# Patient Record
Sex: Female | Born: 1955 | Race: Black or African American | Hispanic: No | Marital: Married | Smoking: Never smoker
Health system: Southern US, Community
[De-identification: ages and names within clinical notes are randomized; demographics above are authoritative.]

## PROBLEM LIST (undated history)

## (undated) DIAGNOSIS — I10 Essential (primary) hypertension: Secondary | ICD-10-CM

---

## 2000-03-20 ENCOUNTER — Inpatient Hospital Stay (HOSPITAL_COMMUNITY): Admission: AD | Admit: 2000-03-20 | Discharge: 2000-03-20 | Payer: Self-pay | Admitting: Obstetrics and Gynecology

## 2000-03-27 ENCOUNTER — Other Ambulatory Visit: Admission: RE | Admit: 2000-03-27 | Discharge: 2000-03-27 | Payer: Self-pay | Admitting: Obstetrics and Gynecology

## 2000-05-16 ENCOUNTER — Encounter: Admission: RE | Admit: 2000-05-16 | Discharge: 2000-08-14 | Payer: Self-pay | Admitting: Obstetrics and Gynecology

## 2000-06-09 ENCOUNTER — Observation Stay (HOSPITAL_COMMUNITY): Admission: AD | Admit: 2000-06-09 | Discharge: 2000-06-10 | Payer: Self-pay | Admitting: Obstetrics and Gynecology

## 2000-06-10 ENCOUNTER — Encounter: Payer: Self-pay | Admitting: Family Medicine

## 2000-06-23 ENCOUNTER — Encounter (INDEPENDENT_AMBULATORY_CARE_PROVIDER_SITE_OTHER): Payer: Self-pay | Admitting: Specialist

## 2000-06-23 ENCOUNTER — Inpatient Hospital Stay (HOSPITAL_COMMUNITY): Admission: AD | Admit: 2000-06-23 | Discharge: 2000-06-29 | Payer: Self-pay | Admitting: Obstetrics and Gynecology

## 2000-06-24 ENCOUNTER — Encounter: Payer: Self-pay | Admitting: Obstetrics and Gynecology

## 2000-06-30 ENCOUNTER — Encounter: Admission: RE | Admit: 2000-06-30 | Discharge: 2000-09-28 | Payer: Self-pay | Admitting: Obstetrics and Gynecology

## 2002-08-25 ENCOUNTER — Encounter: Payer: Self-pay | Admitting: Emergency Medicine

## 2002-08-25 ENCOUNTER — Emergency Department (HOSPITAL_COMMUNITY): Admission: EM | Admit: 2002-08-25 | Discharge: 2002-08-25 | Payer: Self-pay | Admitting: Emergency Medicine

## 2007-09-19 ENCOUNTER — Inpatient Hospital Stay (HOSPITAL_COMMUNITY): Admission: EM | Admit: 2007-09-19 | Discharge: 2007-09-30 | Payer: Self-pay | Admitting: Emergency Medicine

## 2007-09-19 ENCOUNTER — Ambulatory Visit: Payer: Self-pay | Admitting: Critical Care Medicine

## 2007-09-20 ENCOUNTER — Encounter (INDEPENDENT_AMBULATORY_CARE_PROVIDER_SITE_OTHER): Payer: Self-pay | Admitting: *Deleted

## 2007-09-24 ENCOUNTER — Ambulatory Visit: Payer: Self-pay | Admitting: Physical Medicine & Rehabilitation

## 2007-09-30 ENCOUNTER — Ambulatory Visit: Payer: Self-pay | Admitting: Physical Medicine & Rehabilitation

## 2007-09-30 ENCOUNTER — Inpatient Hospital Stay (HOSPITAL_COMMUNITY)
Admission: RE | Admit: 2007-09-30 | Discharge: 2007-10-09 | Payer: Self-pay | Admitting: Physical Medicine & Rehabilitation

## 2007-10-15 ENCOUNTER — Encounter
Admission: RE | Admit: 2007-10-15 | Discharge: 2007-11-20 | Payer: Self-pay | Admitting: Physical Medicine & Rehabilitation

## 2007-10-28 ENCOUNTER — Ambulatory Visit: Payer: Self-pay | Admitting: Internal Medicine

## 2007-10-29 ENCOUNTER — Ambulatory Visit: Payer: Self-pay | Admitting: *Deleted

## 2007-11-04 ENCOUNTER — Encounter (INDEPENDENT_AMBULATORY_CARE_PROVIDER_SITE_OTHER): Payer: Self-pay | Admitting: Internal Medicine

## 2007-11-04 ENCOUNTER — Ambulatory Visit: Payer: Self-pay | Admitting: Physical Medicine & Rehabilitation

## 2007-11-04 ENCOUNTER — Encounter
Admission: RE | Admit: 2007-11-04 | Discharge: 2007-11-05 | Payer: Self-pay | Admitting: Physical Medicine & Rehabilitation

## 2007-11-04 ENCOUNTER — Ambulatory Visit: Payer: Self-pay | Admitting: Family Medicine

## 2007-11-04 LAB — CONVERTED CEMR LAB
BUN: 40 mg/dL — ABNORMAL HIGH (ref 6–23)
Basophils Relative: 1 % (ref 0–1)
CO2: 22 meq/L (ref 19–32)
Calcium: 8.8 mg/dL (ref 8.4–10.5)
Chloride: 102 meq/L (ref 96–112)
Cholesterol: 243 mg/dL — ABNORMAL HIGH (ref 0–200)
Creatinine, Ser: 2.61 mg/dL — ABNORMAL HIGH (ref 0.40–1.20)
Eosinophils Relative: 10 % — ABNORMAL HIGH (ref 0–5)
HCT: 31.6 % — ABNORMAL LOW (ref 36.0–46.0)
HDL: 49 mg/dL (ref >39)
Hemoglobin: 9.8 g/dL — ABNORMAL LOW (ref 12.0–15.0)
Lymphocytes Relative: 20 % (ref 12–46)
MCHC: 31 g/dL (ref 30.0–36.0)
Monocytes Absolute: 0.4 10*3/uL (ref 0.1–1.0)
Monocytes Relative: 11 % (ref 3–12)
Neutro Abs: 2.4 10*3/uL (ref 1.7–7.7)
RBC: 4.02 M/uL (ref 3.87–5.11)
Total CHOL/HDL Ratio: 5
Triglycerides: 183 mg/dL — ABNORMAL HIGH (ref <150)

## 2007-11-19 ENCOUNTER — Inpatient Hospital Stay (HOSPITAL_COMMUNITY): Admission: EM | Admit: 2007-11-19 | Discharge: 2007-11-21 | Payer: Self-pay | Admitting: Emergency Medicine

## 2007-11-19 ENCOUNTER — Ambulatory Visit: Payer: Self-pay | Admitting: Cardiology

## 2007-11-20 ENCOUNTER — Encounter (INDEPENDENT_AMBULATORY_CARE_PROVIDER_SITE_OTHER): Payer: Self-pay | Admitting: Neurology

## 2007-11-24 ENCOUNTER — Emergency Department (HOSPITAL_COMMUNITY): Admission: EM | Admit: 2007-11-24 | Discharge: 2007-11-24 | Payer: Self-pay | Admitting: Emergency Medicine

## 2007-12-09 ENCOUNTER — Encounter: Admission: RE | Admit: 2007-12-09 | Discharge: 2008-03-08 | Payer: Self-pay | Admitting: Neurology

## 2007-12-14 ENCOUNTER — Ambulatory Visit: Payer: Self-pay | Admitting: Family Medicine

## 2007-12-14 ENCOUNTER — Ambulatory Visit: Payer: Self-pay | Admitting: Internal Medicine

## 2007-12-14 LAB — CONVERTED CEMR LAB
Ferritin: 21 ng/mL (ref 10–291)
Hgb A: 60.9 % — ABNORMAL LOW (ref 96.8–97.8)
Saturation Ratios: 21 % (ref 20–55)
TIBC: 325 ug/dL (ref 250–470)
UIBC: 258 ug/dL

## 2007-12-28 ENCOUNTER — Ambulatory Visit: Payer: Self-pay | Admitting: Internal Medicine

## 2008-01-11 ENCOUNTER — Encounter
Admission: RE | Admit: 2008-01-11 | Discharge: 2008-01-13 | Payer: Self-pay | Admitting: Physical Medicine & Rehabilitation

## 2008-01-13 ENCOUNTER — Ambulatory Visit: Payer: Self-pay | Admitting: Physical Medicine & Rehabilitation

## 2008-01-18 ENCOUNTER — Ambulatory Visit: Payer: Self-pay | Admitting: Internal Medicine

## 2008-01-18 ENCOUNTER — Encounter: Payer: Self-pay | Admitting: Family Medicine

## 2008-01-18 LAB — CONVERTED CEMR LAB
Albumin: 4.1 g/dL (ref 3.5–5.2)
CO2: 22 meq/L (ref 19–32)
Creatinine, Ser: 2.79 mg/dL — ABNORMAL HIGH (ref 0.40–1.20)
Glucose, Bld: 116 mg/dL — ABNORMAL HIGH (ref 70–99)
Sodium: 141 meq/L (ref 135–145)

## 2008-01-25 ENCOUNTER — Ambulatory Visit: Payer: Self-pay | Admitting: Internal Medicine

## 2008-02-18 ENCOUNTER — Encounter: Payer: Self-pay | Admitting: Family Medicine

## 2008-02-18 ENCOUNTER — Ambulatory Visit: Payer: Self-pay | Admitting: Internal Medicine

## 2008-02-18 LAB — CONVERTED CEMR LAB
BUN: 31 mg/dL — ABNORMAL HIGH (ref 6–23)
Calcium: 8.8 mg/dL (ref 8.4–10.5)
Glucose, Bld: 116 mg/dL — ABNORMAL HIGH (ref 70–99)
Potassium: 4.1 meq/L (ref 3.5–5.3)

## 2008-03-24 ENCOUNTER — Ambulatory Visit: Payer: Self-pay | Admitting: Internal Medicine

## 2008-03-24 ENCOUNTER — Encounter: Payer: Self-pay | Admitting: Family Medicine

## 2008-03-24 LAB — CONVERTED CEMR LAB
BUN: 32 mg/dL — ABNORMAL HIGH (ref 6–23)
Glucose, Bld: 92 mg/dL (ref 70–99)
Potassium: 4.6 meq/L (ref 3.5–5.3)
Pro B Natriuretic peptide (BNP): 64 pg/mL (ref 0.0–100.0)

## 2008-03-25 ENCOUNTER — Encounter: Payer: Self-pay | Admitting: Family Medicine

## 2008-04-12 ENCOUNTER — Encounter
Admission: RE | Admit: 2008-04-12 | Discharge: 2008-04-12 | Payer: Self-pay | Admitting: Physical Medicine & Rehabilitation

## 2008-05-02 ENCOUNTER — Encounter: Payer: Self-pay | Admitting: Family Medicine

## 2008-05-02 ENCOUNTER — Ambulatory Visit: Payer: Self-pay | Admitting: Internal Medicine

## 2008-05-02 LAB — CONVERTED CEMR LAB
BUN: 41 mg/dL — ABNORMAL HIGH (ref 6–23)
Basophils Relative: 1 % (ref 0–1)
Cholesterol: 177 mg/dL (ref 0–200)
Creatinine, Ser: 2.41 mg/dL — ABNORMAL HIGH (ref 0.40–1.20)
Eosinophils Absolute: 0.1 10*3/uL (ref 0.0–0.7)
Eosinophils Relative: 3 % (ref 0–5)
Hemoglobin: 8.9 g/dL — ABNORMAL LOW (ref 12.0–15.0)
MCHC: 31.4 g/dL (ref 30.0–36.0)
MCV: 79.7 fL (ref 78.0–100.0)
Monocytes Absolute: 0.4 10*3/uL (ref 0.1–1.0)
Monocytes Relative: 9 % (ref 3–12)
Neutrophils Relative %: 69 % (ref 43–77)
RBC: 3.55 M/uL — ABNORMAL LOW (ref 3.87–5.11)
Triglycerides: 47 mg/dL (ref <150)

## 2008-05-13 ENCOUNTER — Ambulatory Visit: Payer: Self-pay | Admitting: Family Medicine

## 2008-05-23 ENCOUNTER — Ambulatory Visit (HOSPITAL_COMMUNITY): Admission: RE | Admit: 2008-05-23 | Discharge: 2008-05-23 | Payer: Self-pay | Admitting: Family Medicine

## 2008-05-27 ENCOUNTER — Encounter: Admission: RE | Admit: 2008-05-27 | Discharge: 2008-05-27 | Payer: Self-pay | Admitting: Family Medicine

## 2008-06-01 ENCOUNTER — Ambulatory Visit: Payer: Self-pay | Admitting: Family Medicine

## 2008-06-01 LAB — CONVERTED CEMR LAB
AST: 18 units/L (ref 0–37)
Albumin: 4.1 g/dL (ref 3.5–5.2)
BUN: 38 mg/dL — ABNORMAL HIGH (ref 6–23)
Calcium: 9 mg/dL (ref 8.4–10.5)
Chloride: 104 meq/L (ref 96–112)
Glucose, Bld: 90 mg/dL (ref 70–99)
Potassium: 4.6 meq/L (ref 3.5–5.3)
Total Protein: 7.4 g/dL (ref 6.0–8.3)

## 2008-06-23 ENCOUNTER — Ambulatory Visit: Payer: Self-pay | Admitting: Family Medicine

## 2008-06-23 LAB — CONVERTED CEMR LAB
BUN: 45 mg/dL — ABNORMAL HIGH (ref 6–23)
Calcium: 8.7 mg/dL (ref 8.4–10.5)
Phenytoin Lvl: 14.5 ug/mL (ref 10.0–20.0)
Potassium: 4.6 meq/L (ref 3.5–5.3)
Sodium: 138 meq/L (ref 135–145)

## 2008-07-27 ENCOUNTER — Ambulatory Visit: Payer: Self-pay | Admitting: Internal Medicine

## 2008-08-11 ENCOUNTER — Ambulatory Visit: Payer: Self-pay | Admitting: *Deleted

## 2008-08-22 ENCOUNTER — Ambulatory Visit: Payer: Self-pay | Admitting: Internal Medicine

## 2008-08-22 ENCOUNTER — Encounter: Payer: Self-pay | Admitting: Family Medicine

## 2008-08-22 LAB — CONVERTED CEMR LAB
Albumin: 4.7 g/dL (ref 3.5–5.2)
Basophils Absolute: 0 10*3/uL (ref 0.0–0.1)
CO2: 22 meq/L (ref 19–32)
Calcium, Total (PTH): 9.8 mg/dL (ref 8.4–10.5)
Chloride: 100 meq/L (ref 96–112)
Eosinophils Relative: 3 % (ref 0–5)
Glucose, Bld: 115 mg/dL — ABNORMAL HIGH (ref 70–99)
HCT: 34.6 % — ABNORMAL LOW (ref 36.0–46.0)
Lymphocytes Relative: 24 % (ref 12–46)
Lymphs Abs: 0.9 10*3/uL (ref 0.7–4.0)
Neutro Abs: 2.5 10*3/uL (ref 1.7–7.7)
PTH: 96.4 pg/mL — ABNORMAL HIGH (ref 14.0–72.0)
Platelets: 226 10*3/uL (ref 150–400)
Potassium: 3.8 meq/L (ref 3.5–5.3)
Sodium: 141 meq/L (ref 135–145)
WBC: 3.7 10*3/uL — ABNORMAL LOW (ref 4.0–10.5)

## 2008-08-26 ENCOUNTER — Emergency Department (HOSPITAL_COMMUNITY): Admission: EM | Admit: 2008-08-26 | Discharge: 2008-08-26 | Payer: Self-pay | Admitting: Emergency Medicine

## 2008-10-19 ENCOUNTER — Encounter: Payer: Self-pay | Admitting: Family Medicine

## 2008-10-19 ENCOUNTER — Ambulatory Visit: Payer: Self-pay | Admitting: Internal Medicine

## 2009-06-06 ENCOUNTER — Ambulatory Visit: Payer: Self-pay | Admitting: Internal Medicine

## 2009-07-20 ENCOUNTER — Emergency Department (HOSPITAL_COMMUNITY): Admission: EM | Admit: 2009-07-20 | Discharge: 2009-07-21 | Payer: Self-pay | Admitting: Emergency Medicine

## 2010-05-07 ENCOUNTER — Ambulatory Visit: Payer: Self-pay | Admitting: Internal Medicine

## 2010-05-07 LAB — CONVERTED CEMR LAB
ALT: 25 units/L (ref 0–35)
Basophils Absolute: 0 10*3/uL (ref 0.0–0.1)
CO2: 21 meq/L (ref 19–32)
CRP: 0.2 mg/dL (ref <0.6)
Calcium: 9.6 mg/dL (ref 8.4–10.5)
Chloride: 106 meq/L (ref 96–112)
Creatinine, Ser: 2.8 mg/dL — ABNORMAL HIGH (ref 0.40–1.20)
Eosinophils Relative: 4 % (ref 0–5)
Glucose, Bld: 103 mg/dL — ABNORMAL HIGH (ref 70–99)
HCT: 30.7 % — ABNORMAL LOW (ref 36.0–46.0)
Hemoglobin: 9.9 g/dL — ABNORMAL LOW (ref 12.0–15.0)
Hgb A1c MFr Bld: 5.9 % — ABNORMAL HIGH (ref <5.7)
Lymphocytes Relative: 31 % (ref 12–46)
Lymphs Abs: 1.3 10*3/uL (ref 0.7–4.0)
Monocytes Absolute: 0.3 10*3/uL (ref 0.1–1.0)
Monocytes Relative: 7 % (ref 3–12)
Neutro Abs: 2.5 10*3/uL (ref 1.7–7.7)
RBC: 3.54 M/uL — ABNORMAL LOW (ref 3.87–5.11)
TSH: 1.593 microintl units/mL (ref 0.350–4.500)
Total Bilirubin: 0.3 mg/dL (ref 0.3–1.2)
Total Protein: 7.4 g/dL (ref 6.0–8.3)
Uric Acid, Serum: 8.7 mg/dL — ABNORMAL HIGH (ref 2.4–7.0)
WBC: 4.2 10*3/uL (ref 4.0–10.5)

## 2010-05-08 ENCOUNTER — Encounter (INDEPENDENT_AMBULATORY_CARE_PROVIDER_SITE_OTHER): Payer: Self-pay | Admitting: *Deleted

## 2010-06-05 ENCOUNTER — Ambulatory Visit: Payer: Self-pay | Admitting: Internal Medicine

## 2010-06-05 LAB — CONVERTED CEMR LAB
BUN: 48 mg/dL — ABNORMAL HIGH (ref 6–23)
CO2: 25 meq/L (ref 19–32)
Glucose, Bld: 91 mg/dL (ref 70–99)
Potassium: 4.4 meq/L (ref 3.5–5.3)
Sodium: 140 meq/L (ref 135–145)

## 2010-11-08 NOTE — Letter (Signed)
Summary: Results Follow-up Letter  [facility]  [address]   [place], [place] [address]   Phone: [phone]  Fax: [phone]    05/08/2010  [address] [place],   [address]  Dear [name],   The following are the results of your recent test(s):  Test     Result     Pap Smear    Normal_______  Not Normal_____       Comments: _________________________________________________________ Cholesterol LDL(Bad cholesterol):    108      Your goal is less than: 100        HDL (Good cholesterol):        Your goal is more than: _________________________________________________________ Other Tests: Your main problem is with your kidney function, your kidneys are "dried out". You SHOULD CONTINUE to take FUROSEMIDE daily and you SHOULD NOT CONTINUE your morning dose of LISINOPRIL/HCTZ.  _________________________________________________________  Please call for an appointment Or DR.Nedra Hai would like for you to return to the office on Friday 05-18-2010@ 11:30am for labs and a blood pressure check. Be sure to take your medications._________________________________________________________ _________________________________________________________ _________________________________________________________  Sincerely,  Willodean Rosenthal

## 2010-11-29 ENCOUNTER — Encounter (INDEPENDENT_AMBULATORY_CARE_PROVIDER_SITE_OTHER): Payer: Self-pay | Admitting: *Deleted

## 2010-11-29 LAB — CONVERTED CEMR LAB
Alkaline Phosphatase: 69 units/L (ref 39–117)
CO2: 26 meq/L (ref 19–32)
Creatinine, Ser: 1.92 mg/dL — ABNORMAL HIGH (ref 0.40–1.20)
Eosinophils Absolute: 0.2 10*3/uL (ref 0.0–0.7)
Eosinophils Relative: 4 % (ref 0–5)
Glucose, Bld: 90 mg/dL (ref 70–99)
HCT: 36 % (ref 36.0–46.0)
Hemoglobin: 11.5 g/dL — ABNORMAL LOW (ref 12.0–15.0)
LDL Cholesterol: 100 mg/dL — ABNORMAL HIGH (ref 0–99)
Lymphs Abs: 1.5 10*3/uL (ref 0.7–4.0)
MCHC: 31.9 g/dL (ref 30.0–36.0)
MCV: 87 fL (ref 78.0–100.0)
Monocytes Absolute: 0.3 10*3/uL (ref 0.1–1.0)
Monocytes Relative: 8 % (ref 3–12)
RBC: 4.14 M/uL (ref 3.87–5.11)
TSH: 2.101 microintl units/mL (ref 0.350–4.500)
Total Bilirubin: 0.3 mg/dL (ref 0.3–1.2)
Triglycerides: 66 mg/dL (ref <150)
VLDL: 13 mg/dL (ref 0–40)
WBC: 4 10*3/uL (ref 4.0–10.5)

## 2011-01-10 LAB — POCT I-STAT, CHEM 8
Glucose, Bld: 101 mg/dL — ABNORMAL HIGH (ref 70–99)
HCT: 34 % — ABNORMAL LOW (ref 36.0–46.0)
Hemoglobin: 11.6 g/dL — ABNORMAL LOW (ref 12.0–15.0)
Potassium: 3.7 mEq/L (ref 3.5–5.1)
Sodium: 140 mEq/L (ref 135–145)
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
## 2011-02-19 NOTE — Discharge Summary (Signed)
NAMEHEYLI, [name] NO.:  [id number]   MEDICAL RECORD NO.:  [id number]          PATIENT TYPE:  IPS   LOCATION:  4028                         FACILITY:  [facility]   PHYSICIAN:  Jarvis Morgan, M.D.   DATE OF BIRTH:  03/13/1956   DATE OF ADMISSION:  09/30/2007  DATE OF DISCHARGE:  10/09/2007                               DISCHARGE SUMMARY   DISCHARGE DIAGNOSES:  1. Left parietal intracerebral hemorrhage.  2. Dysphagia.  3. Seizure disorder.  4. Hypertension.  5. Right lung pneumonia, resolved.  6. Chronic renal insufficiency.  7. Subcutaneous Lovenox for deep vein thrombosis prophylaxis.   A 55 year old [place] American female, admitted September 19, 2007 with  altered mental status and right gaze.  Cranial CT scan showed left  parietal intracerebral hemorrhage.  MRI showed 3.1 cm hematoma left  posterior temporal region.  Carotid Dopplers negative for internal  carotid artery stenosis.  Echocardiogram with ejection fraction 60%,  without thrombi.  Noted seizure.  Loaded with Dilantin.  EEG showed  abnormal study, with predominately bifrontal dealt activity consistent  with FIRDA.  Chronic renal insufficiency, creatinine increased at 2.73.  Renal services followup.  Renal ultrasound negative.  Blood pressure  monitored, with multiple antihypertensive medications.  Swallow study.  Diet advanced to a dysphagia II thin liquid diet.  She was completing a  course of Zosyn for aspiration pneumonia.  She had been placed on  subcutaneous Lovenox for deep vein thrombosis prophylaxis.   PAST MEDICAL HISTORY:  See discharge diagnoses.  No alcohol or tobacco.   SOCIAL HISTORY:  Lives with husband.  She works as a Equities trader.  Husband can assist as needed.  They have three children, ages 29, 11, and  52.   MEDICATIONS PRIOR TO ADMISSION:  Clonidine, tramadol, Lasix, potassium  chloride, and Toprol-XL.   REHABILITATION HOSPITAL COURSE:  The patient was admitted to  inpatient  rehab services, with therapies initiated on a 3-hour daily basis,  consisting of physical therapy, occupational therapy, speech therapy,  and rehabilitation nursing.  The following issues were addressed during  the patient's rehabilitation stay.  Pertaining to Mrs. Johnsie Cancel left  parietal intracerebral hemorrhage conservative care provided.  She was  making slow progressive gains.  Full family teaching was completed.  She  was min to mod assist for her overall mobility.  Diet had been advanced  to a regular-consistency diet.  She remained on Dilantin 300 mg twice  daily for seizure disorder, with no seizure activity detected during her  rehab service stay.  Blood pressures monitored with Norvasc, Catapres,  Lasix, BiDil, Prinivil, and Normodyne.  It was stressed to family the  need to follow up with medical care, which she would see both  [facility] as well as renal services for chronic renal insufficiency,  with baseline creatinine of 1.8-2.2.  She remained on subcutaneous  Lovenox for deep vein thrombosis prophylaxis up until her day of  discharge.   Latest labs showed a sodium of 138, potassium 3.5, BUN 16, creatinine  2.28, with baseline 1.8-2.2.  She was discharged to home.   DISCHARGE MEDICATIONS:  1.  Norvasc 10 mg daily.  2. Clonidine 0.3 mg 1 pill q.8 h.  3. Lasix 80 mg 1 pill twice daily  4. BiDil 1 pill twice daily.  5. Prinivil 20 mg 1 pill twice daily.  6. Dilantin 100 mg 2-1/2 pills at bedtime.  7. Normodyne 300 mg 1 pill twice daily.  8. Prilosec over the counter.  9. Potassium chloride 20 mEq daily.   DIET:  Lowfat, low cholesterol, low carbohydrates.   She would follow up with [facility] on October 22, 2007, as  well as Dr. Florene Glen of renal services on October 27, 2007,  Dr. Jarvis Morgan on November 05, 2007.      Lauraine Rinne, P.A.    ______________________________  Jarvis Morgan, M.D.    DA/MEDQ  D:  10/12/2007  T:  10/12/2007   Job:  [id number]   cc:   Darrold Span. Florene Glen, M.D.  [facility]

## 2011-02-19 NOTE — Discharge Summary (Signed)
NAMEMALAYJA, NICOSIA NO.:  [id number]   MEDICAL RECORD NO.:  [id number]           PATIENT TYPE:   LOCATION:                                 FACILITY:   PHYSICIAN:  Jarvis Morgan, M.D.   DATE OF BIRTH:  07/12/1956   DATE OF ADMISSION:  DATE OF DISCHARGE:                               DISCHARGE SUMMARY   PRIMARY CARE PHYSICIAN:  Unknown.   NEUROLOGIST:  Shaune Pascal. Estella Husk, M.D.   NEPHROLOGIST:  Sherril Croon, M.D.   HISTORY OF PRESENT ILLNESS:  Ms. Bencomo is a 55 year old African American  female with history of hypertension and chronic renal insufficiency with  baseline creatinine of 1.8-2.2.   The patient was admitted September 19, 2007, with altered mental status  along with right gaze preference.  Cranial CT was positive for left  parietal intracranial hemorrhage.  MRI study showed a 3.1 cm hematoma in  the left posterior temporal lobe.  Carotid Dopplers were negative for  internal carotid artery stenosis.  Echocardiogram showed ejection  fraction of 60% without thrombi.  She was noted to have seizures and was  loaded with Dilantin September 26, 2007.  EEG was abnormal with  predominantly bifrontal delta activity consistent with FIRDA.   The patient was noted to have an elevated creatinine to 2.73 and  [facility] was asked to evaluate the patient with Dr. Justin Mend seeing  the patient.  A renal ultrasound was negative.  The patient was  monitored on intake and output, but it was felt that the creatinine was  elevated secondary to baseline creatinine elevation with the addition of  probable contrast dye.   Blood pressure has been monitored.  Metoprolol with clonidine recently  discontinued September 29, 2007, with the addition of Coreg up to 37.5 mg  b.i.d. as of October 05, 2007.   FEES study was done September 24, 2007, and she was made n.p.o. with her  diet recently advanced to D-2 thin liquids.  There was possible  aspiration pneumonia on chest  x-ray September 24, 2007, and she was  treated with IV Zosyn September 21, 2007, with plan for follow-up chest x-  ray.  Subcu Lovenox was added for DVT prophylaxis.  Follow-up cranial CT  September 23, 2007, showed stable unchanged study.  Random blood sugars  have been up and down with plan for check of hemoglobin A1c.   The patient was evaluated by the rehabilitation physicians and felt to  be an appropriate candidate for a trial of inpatient rehabilitation.   REVIEW OF SYSTEMS:  Noncontributory.   PAST MEDICAL HISTORY:  1. Chronic anemia.  2. Hypertension.   FAMILY HISTORY:  Positive for coronary artery disease.   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  Her can  reportedly assist as needed.  They have three children ages 75, 78 and  6.  The patient does not use alcohol or tobacco.   FUNCTIONAL HISTORY PRIOR ADMISSION:  Independent and reportedly working.   ALLERGIES:  NO KNOWN  DRUG ALLERGIES.   MEDICATIONS PRIOR TO ADMISSION:  1. Clonidine 0.1 mg b.i.d.  2. Tramadol one tablet t.i.d. p.r.n.  3. Lasix 20 mg b.i.d.  4. Potassium chloride daily.  5. Toprol XL 50 mg daily.   LABORATORY:  Recent hemoglobin was 8.5, hematocrit of 26, platelet count  of 399,000, white count of 8.5.  Recent Dilantin level was 14.3 on  September 23, 2007.  Recent sodium was 136, potassium 3.7, chloride 108,  bicarbonate 20, BUN 8 and creatinine 2.68 measured September 30, 2007.   PHYSICAL EXAMINATION:  GENERAL APPEARANCE:  A reasonably well-appearing  middle-aged adult female seated up in a chair.  VITAL SIGNS:  Blood pressure was 185/81 with pulse of 86, respiratory  rate 20 and temperature 98.5.  HEENT:  Normocephalic, nontraumatic.  CARDIOVASCULAR:  Regular rate and rhythm, S1-S2 without murmurs.  ABDOMEN:  Soft, slightly obese, nontender with positive bowel sounds.  LUNGS:  Clear to auscultation  bilaterally.  NEUROLOGICAL:  Moderately alert and oriented x1-2 with cues.  She has a  very soft voice was only able to tell me her first name.  She was not  sure where she worked as a Marine scientist but she could tell me that she did work  as a Marine scientist.  She had significant difficulty following even one-step  commands during the manual muscle testing.  Bilateral upper extremity  exam appears to have 4+/5 strength throughout.  Bulk and tone were  normal.  Reflexes were 2+ and symmetrical.  EXTREMITIES:  Lower extremity exam revealed hip flexion, knee extension,  ankle dorsiflexion and 4-/5 bilaterally.   IMPRESSION:  1. Status post left parietal intracranial hemorrhage, likely related      to hypertension.  2. Dysphagia with speech therapy following.  3. History of renal insufficiency with elevated creatinine from      baseline of 1-2.2.  4. Seizure disorder presently on Dilantin b.i.d. after above noted      bleed.  5. Hypertension.  6. Right lung pneumonia with follow-up chest x-ray planned.   Presently the patient has deficits in activities of daily living,  transfers, ambulation and cognition related to the above-noted left  parietal intracranial hemorrhage.   PLAN:  1. Admit to the rehabilitation unit for daily therapies to include      a.     Physical therapy for range of motion, strengthening, bed       mobility, transfers, pregait training, gait training and equipment       evaluation.      b.     Occupational therapy for range of motion, strengthening,       ADLs, cognitive/perceptual training, splinting and equipment       evaluation.      c.     Rehab nursing for skin care, wound care, bowel and bladder       training as necessary.      d.     Speech therapy for higher level cognition and evaluation of       swallow and aphagia.      e.     Case management to assess home environment, assist with       discharge planning and arrange for appropriate follow-up care.      f.      Social work to assess family and social support,       consultation regarding disability issues and assist in discharge       planning.  2. Continue D2 thin liquid diet.  3. Check admission labs including hemoglobin A1c, CBC with      differential, and CMET Friday, October 02, 2007,  4. Incentive spirometry q.2h. while awake.  5. Tylenol 325 mg 1-2 tablets p.o. q.4h. p.r.n. for pain.  6. Coreg 37.5 mg p.o. b.i.d.  7. Handheld nebulizer albuterol 2.5 mg q.6h. p.r.n.  8. Subcu Lovenox 40 mg daily for DVT prophylaxis.  9. Norvasc 10 mg p.o. daily.  10.Protonix 40 mg p.o. daily.  11.Dilantin 250 mg p.o. nightly.  12.BiDil 20/37.5 one tablet b.i.d.  13.IV Zosyn 3.375 grams q.6h. x2 days.  14.PA and lateral chest x-ray to follow up right lung pneumonia.  15.Potassium chloride 20 mEq p.o. daily.  16.Old EKG to chart.  17.Routine turning to prevent skin breakdown.  18.Dulcolax suppository one per rectum daily p.r.n.  19.Strict in's and out's.  20.Discontinue IV fluids if not already done.  21.Discontinue Foley tube.   PROGNOSIS:  Fair.   ESTIMATED LENGTH OF STAY:  Fifteen to 30 days.   GOALS:  Modified independent transfers and ambulation and standby assist  for ADLs with min to mod assist for higher level cognition and household  management.           ______________________________  Jarvis Morgan, M.D.     DC/MEDQ  D:  09/30/2007  T:  09/30/2007  Job:  [id number]

## 2011-02-19 NOTE — Discharge Summary (Signed)
NAMEDEZERAY, MARCIEL              ACCOUNT NO.:  [id number]   MEDICAL RECORD NO.:  [id number]          PATIENT TYPE:  INP   LOCATION:  3013                         FACILITY:  [facility]   PHYSICIAN:  Pramod P. Leonie Man, MD    DATE OF BIRTH:  10-11-1955   DATE OF ADMISSION:  11/19/2007  DATE OF DISCHARGE:  11/21/2007                               DISCHARGE SUMMARY   ADMISSION DIAGNOSIS:  Facial droop.   DISCHARGE DIAGNOSES:  1. 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  2. Hypertension.  3. Recent left parietal intracerebral hemorrhage from December 2008.   DISCHARGE DIAGNOSES:  1. 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  2. Multiple punctate micro-hemorrhages from hypertension in the past.   HOSPITAL COURSE:  [name] is a 55 year old African lady who was  admitted to [facility] with sudden onset of facial droop.  This  started a couple of days prior to admission.  She did not have any focal  extremity weakness or numbness.  She had an MRI scan of the brain done  in the emergency room, which showed acute small infarct in the right  basal ganglia and left frontal subcortical white matter.  She had a  previous history of left parietal brain hemorrhage in December 2008 and  had been off antiplatelet therapy for that.  Her past medical history is  significant for uncontrolled hypertension and a recent brain hemorrhage.  The patient was admitted to the stroke service for evaluation and  remained stable during the hospital stay.  Her blood pressure was  tightly controlled.  She underwent a carotid ultrasound, which showed no  significant stenosis.  Her lipid profile showed elevated total  cholesterol of 233 with LDL of 155.  The patient was started on Zocor  for elevated lipids and aspirin for stroke prevention. Her homocystine  level was elevated 17.1.  Dilantin level was therapeutic  at 11.9,  hemoglobin A1c was borderline at 6.7.  Urine drug screen was negative.  Urinalysis was unremarkable.  Admission CBC and metabolic panel were  both unremarkable.  The patient remained stable during hospitalization.  Her facial droop improved. On the day of discharge she had stable vital  signs with blood pressure 139/79, heart rate 80 per minute, respiratory  20 per minute.  Her facial droop had resolved.  She had minimum  diminished fine finger movements in the left hand.   She was advised to followup with her primary physician for blood  pressure control and she had a transesophageal echocardiogram done on  day prior to discharge, which showed a small patent foramen ovale, but  no cardiac source of embolism.  She was advised to followup with Dr.  Leonie Man in his office in 2 weeks for transcranial Doppler emboli  monitoring and bubble study to further categorize the PFO.  At the time  of discharge she was also was advised to followup with outpatient speech  therapy for swallow and facial exercises.   DISCHARGE MEDICATIONS:  At the time of  discharge her medications were:  1. Zocor 20 mg daily.  2. Aspirin 81 mg daily.  3. Potassium 20 mEq daily.  4. Lasix 80 mg daily.  5. Tramadol as needed.  6. Metoprolol, resume home dose.  7. Clonidine 0.3 mg every 8 hours.  8. Norvasc 10 mg daily.  9. Dilantin 200 mg at bedtime,  10.Hydralazine 25 mg daily.  11.Labetalol 300 twice daily.           ______________________________  Kathie Rhodes. Leonie Man, MD     PPS/MEDQ  D:  11/21/2007  T:  11/23/2007  Job:  [id number]

## 2011-02-19 NOTE — Procedures (Signed)
HISTORY:  This is a 55 year old patient who has sustained a left  parietal intracranial hemorrhage.  The patient is being evaluated for  ongoing confusion.  This is a portable EEG recording.  No skull defects  are noted.   MEDICATIONS:  1. Tylenol.  2. Ventolin.  3. Norvasc.  4. Catapres.  5. NovoLog.  6. Protonix.  7. Dilantin.  8. Lopressor.  9. Potassium.   EEG CLASSIFICATION:  Delta grade 2 bifrontal, FIRDA.   DESCRIPTION:  According to the background rhythm, this recording  consists of a fairly well modulated medium amplitude alpha rhythm of 8  Hz that seems to be reactive to eye opening and closure.  As the record  progresses, the most notable feature of the recording consists of  intermittent bifrontal 2 Hz delta activity that is seen off and on  throughout the entirety of the recording.  At times, the background  rhythms are quite well organized and symmetric.  The patient undergoes  photic stimulation with a minimal bilateral photic drive response.  Hyperventilation is never performed.  At no time during the recording  does there appear to be evidence of spike wave discharges or evidence of  focal slowing.  EKG monitor shows no evidence of cardiac rhythm  abnormalities with a heart rate of 90.   IMPRESSION:  This is an abnormal EEG recording due to intermittent  predominantly bifrontal delta activity that is consistent with FIRDA.  Such a recording suggests process is affecting the deep midline nuclei  or maybe associated with hydrocephalus.  No clear epileptiform  discharges were seen however.  Clinical correlation is required.      Jill Alexanders, M.D.  Electronically Signed     [id number]  D:  09/24/2007 17:18:56  T:  09/25/2007 15:08:40  Job #:  [id number]

## 2011-02-19 NOTE — Consult Note (Signed)
[name], [place]              ACCOUNT NO.:  [id number]   MEDICAL RECORD NO.:  [id number]          PATIENT TYPE:  INP   LOCATION:  1825                         FACILITY:  [facility]   PHYSICIAN:  Burnett Harry. Joya Gaskins, MD, FCCPDATE OF BIRTH:  July 17, 1956   DATE OF CONSULTATION:  09/19/2007  DATE OF DISCHARGE:                                 CONSULTATION   REASON FOR CONSULTATION:  Unresponsive patient with hemorrhagic stroke  with acute respiratory failure and ventilator management.   HISTORY OF PRESENT ILLNESS:  This is a 55 year old African-American  female patient that was found unresponsive at home. She was subsequently  transported to the emergency department at [facility].  Initial  blood pressure was 290/150.  Patient had a grand mal seizure while in  the emergency room and required intubation.  CT scan of the head showed  a left parietal bleed.  Critical care team was called in for  consultation to help with ventilator management.  The patient is  currently in ER room 13.  She is currently unresponsive on the  ventilator.  Blood pressure is currently 200/100 and Cardene drip has  been ordered for her blood pressure.  There are no family members or  past medical history available.  I was able to look her up in the E-  chart system with a discharge summary in 2001 status post a cesarean  section.  According to that note the patient does have a history of  hypertension. She did have preeclampsia during her pregnancy.  Nurse is  at bed side and reports that patient had an episode of what she believes  was V-tach for a very short period of time.   PAST MEDICAL HISTORY:  As above, hypertension and preeclampsia during  pregnancy.   CURRENT MEDICATIONS:  Questionable what she was on prior to admission.  The patient will be placed on a Cardene drip and phenytoin.   ALLERGIES:  Unknown.   FAMILY HISTORY:  Unknown.   SOCIAL HISTORY:  Unknown.   REVIEW OF SYSTEMS:  The  patient, as above, did have a seizure while in  the emergency room.   PHYSICAL EXAMINATION:  GENERAL:  The patient is a morbidly obese,  unresponsive female currently on the ventilator with ET tube in place.  HEENT:  Atraumatic, normocephalic.  Patient with ET tube in place.  Patient has normal dentition without any obvious dental caries. Pupils  are sluggish.  NECK:  Supple without cervical adenopathy.  No JVD.  Carotids are equal,  positive upstrokes bilaterally without any bruits.  LUNGS:  Coarse breath sounds are revealed bilaterally without wheezing  or crackles.  CARDIAC:  Sinus tachycardia.  ABDOMEN:  Morbidly obese, soft.  OG tube is in place to drainage.  EXTREMITIES:  Warm without any notable swelling. Pulses are intact  bilaterally.  NEURO:  Patient is unresponsive and pupils are sluggish.   LABORATORY DATA:  White blood cell count 11,100, hemoglobin 11, platelet  count 380,000.  PT 12.5, INR 0.9.  Sodium 137, potassium 3.2, chloride  105, CO2 26, creatinine 1.62, BUN 34 and glucose 164.  CT scan of the  head shows a left parietal bleed.   IMPRESSION AND PLAN:  1. Acute respiratory failure secondary to unresponsiveness with a      hemorrhagic cerebrovascular accident and malignant hypertension      with subsequent seizure.  Patient was intubated in the emergency      room for airway protection and placed on ventilatory support.  Will      place arterial line. Arterial blood gas is pending. Will adjust      ventilator settings accordingly.  Chest x-ray is also pending and      patient has been taken to radiology.  2. Malignant hypertension.  Patient is to begin a Cardene drip as      ordered.  Titrate for systolic blood pressure between 140-160.  3. Possible episode of ventricular tachycardia versus sinus      tachycardia.  Suspect may be secondary to seizure activity and/or      hypertension. Will check an EKG and one set of cardiac enzymes.  4. Hypokalemia.  Will  replace.  5. Acute renal insufficiency, unclear exactly what patient's baseline      creatine is.  Will continue to monitor and provide intravenous      hydration as indicated.  6. Hyperglycemia.  Questionable secondary to stress and/or possible      underlying diabetes mellitus. Patient is morbidly obese.   Hopefully there will be some family members present soon so we can  obtain a better history.      Rexene Edison, NP      Burnett Harry Joya Gaskins, MD, [facility]  Electronically Signed    TP/MEDQ  D:  09/19/2007  T:  09/20/2007  Job:  [id number]

## 2011-02-19 NOTE — H&P (Signed)
[name], [name]              ACCOUNT NO.:  [id number]   MEDICAL RECORD NO.:  [id number]           PATIENT TYPE:   LOCATION:                               FACILITY:  [place]   PHYSICIAN:  Shaune Pascal. Champey, M.D.DATE OF BIRTH:  05-24-1956   DATE OF ADMISSION:  09/19/2007  DATE OF DISCHARGE:                              HISTORY & PHYSICAL   REQUESTING PHYSICIAN:  Dellis Filbert P. Caporossi, M.D.   REASON FOR ADMISSION:  Code stroke.   HISTORY OF PRESENT ILLNESS:  [name] is a 55 year old African American  female with a past medical history of hypertension, the rest of her past  medical history is unknown.  The patient presents after being found down  unresponsive with a right gaze preference.  A code stroke was called en  route.  In the ED, the patient had some facial twitching, was not moving  any extremities, had an episode of incontinence, subsequently had a  grand mal seizure requiring Ativan.  A CT of the head showed a left  parietal intracerebral hemorrhage, small vessel disease, and lacunes.  The patient was intubated and sedated for airway protection.  No further  history is possible.  No family is currently present.   PAST MEDICAL HISTORY:  Positive for hypertension.  The rest of the past  medical history is unknown at this time.   MEDICATIONS:  Unknown.   FAMILY HISTORY:  Unknown.   ALLERGIES:  No known drug allergies (as per Echart).   SOCIAL HISTORY:  Unknown.   REVIEW OF SYSTEMS:  Unable to assess at this time secondary to sedation  and intubation.   PHYSICAL EXAMINATION:  VITAL SIGNS:  Blood pressure is 235/121, pulse is  127, respirations 16, O2 sat is 100%.  HEENT:  Normocephalic atraumatic.  The patient is sedated and paralyzed.  Pupils are mid size and nonreactive.  No Doll's eyes present.  HEART:  Tachycardic.  LUNGS:  Coarse.  ABDOMEN:  Soft.  EXTREMITIES:  Show good pulses.  NEUROLOGIC:  The patient is intubated, sedated, and paralyzed.  Negative  Doll's eyes.  Pupils are nonreactive.  The patient does have a gag  present slightly, sluggishly.  Motor examination, no spontaneous  movements, tone is decreased, no withdrawal to pain bilaterally on  sensory examination.  Reflexes are none throughout.   CT shows left parietal and cerebral hemorrhage with small vessel disease  and lacunes.   LABORATORY:  WBC 11.1, hemoglobin 11, hematocrit is 35.8, platelets 380.  PT is 12.5, INR is 0.9, PTT is 29.   IMPRESSION:  This is a 55 year old African American female with left  parietal and cerebral hemorrhage, likely secondary to hypertension and  subsequent seizures.   The patient is not a candidate for IV t-PA secondary to intracerebral  hemorrhage and seizures.   1. We will admit the patient to the stroke M.D. service, start a      Cardene drip and titrate to keep systolic blood pressure less than      180.  2. We will load the patient with Dilantin and start maintenance at 100  mg q.8 h.  3. We will check an MRI MRA of the brain, a 2D echocardiogram, carotid      Dopplers, check fasting lipids and homocystine level.  We will get      a PT OT consult.  4. We will consult pulmonary critical care for vent and blood pressure      and continuous monitoring.  5. We will repeat her CT scan in the morning.  6. We will follow the patient.      Shaune Pascal. Estella Husk, M.D.  Electronically Signed     DRC/MEDQ  D:  09/19/2007  T:  09/20/2007  Job:  [id number]

## 2011-02-19 NOTE — Assessment & Plan Note (Signed)
HISTORY:  [name] returns to the clinic today for a follow-up  evaluation, accompanied by her husband.  The patient is a 55 year old  adult female who was admitted on September 19, 2007, with altered mental  status.  An MRI study of the brain showed a 3.1 cm hematoma in the left  posterior/temporal region.  She was subsequently stabilized and was  noted to have seizure activity and was loaded with Dilantin.  She was  subsequently moved to the rehabilitation unit on September 30, 2007, and  remained there through discharge on October 09, 2007.   At this point the patient has completed all therapy and actually had  therapy stopped in February, secondary to a poor response.  She has been  walking without any device and is independent with bathing and dressing.  She still has moderately slow thinking process and slow retrieval of  information.  Her husband reports that she is in the process of trying  to study for a nurse practitioner exam.  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  She has a scheduled test at the end of  this month, but she is not sure if she is ready to take that test.  Most  of the information is gained through her husband.  She is very slow in  terms of her thinking processes and slow to answer questions.  He  generally jumps in to answer questions for her.  He does note that she  is improved in terms of helping with the children at home without  instructions.  That was a problem for her when she initially got home,  but she seems to be taking on more parenteral duties at this time.   REVIEW OF SYSTEMS:  Positive for weight gain and coughing.   MEDICAL DECISION MAKING:  1. Labetalol 200 mg, 1-1/2 tab p.o. twice daily.  2. Clonidine 0.3 mg three times daily.  3. Dilantin 250 mg q.h.s.  4. Isosorbide 20 mg twice daily.  5. Furosemide 80 mg twice daily.  6. Hydralazine 1.5 mg twice  daily.  7. Potassium chloride 20 mEq daily.  8. Norvasc 10 mg daily.  9. Crestor daily.  10.Diovan daily.   PHYSICAL EXAMINATION:  GENERAL:  A well-appearing, slightly overweight  adult female, seated in a regular chair.  VITAL SIGNS:  Blood pressure 146/73, pulse 76, respirations 20, O2  saturation 100% on room air.  NEUROLOGIC:  She ambulates without any assistive device.  Strength was 5-  /5 throughout the bilateral upper and lower extremities.  She does have  slightly slow cognitive abilities and slow response times.   IMPRESSION:  1. Status post left parietal intracerebral hemorrhage.  2. Seizure disorder.  3. Hypertension.  4. Chronic renal insufficiency.  5. Slightly slow cognition.   In the office today no adjustment of medications was made.  She is  focusing on passing her nurse examination practitioner test.  She is not  interested in returning to work at the present time.  I feel that she  would probably have some difficulties and I am concerned that she will  have trouble passing the test, given her present response levels.  Fortunately the test is all written without verbal exam.   FOLLOWUP:  We will plan on seeing the patient in followup in  approximately three months' time.  She is making slow but gradual  progress  overall.           ______________________________  Jarvis Morgan, M.D.     DC/MedQ  D:  01/13/2008 10:41:43  T:  01/13/2008 11:13:00  Job #:  [id number]

## 2011-02-19 NOTE — Assessment & Plan Note (Signed)
[name] returns to the clinic today for followup evaluation  accompanied by her husband.  The patient is a 55 year old African-  American female who was admitted September 19, 2007 with altered mental  status and right gaze preference.  Cranial CT showed left parietal  intracerebral hemorrhage.  MRI studies showed 3.1 cm hematoma in the  left posterior/temporal region.  Carotid Dopplers were negative for  internal carotid artery stenosis.  Echocardiogram showed an ejection  fraction of 60% without thrombi.  She was noted to have seizure activity  and was loaded with dilantin.  EEG showed abnormal study with  predominantly bifrontal delta activity consistent with FIRDA.  Chronic  renal insufficiency was noted with a creatinine of 2.73.  Renal services  plans followup.  A renal ultrasound was negative.  Blood pressure has  been monitored on multiple hypertensive medicines.  She was advanced to  a D2 thin liquid diet.  She was completing a course of Zosyn when she  was moved to the rehabilitation unit September 30, 2007.  She remains  there through discharge October 09, 2007.   Since discharge, the patient has been back to [facility], and they have  adjusted her labetalol and hydralazine up a half a tablet each.  She is  also on a low-sodium diet and eating regular consistency foods.  She is  receiving speech therapy only at the [facility] address.  She  continues on dilantin, and her husband has noted some decreased affect  and possible slowing of thought.   The patient reports that she is continent of bowel and bladder, and  ambulates without any assistive device.   REVIEW OF SYSTEMS:  Noncontributory.   MEDICATIONS:  1. Labetalol 200 mg 1.5 tablets p.o. b.i.d.  2. Clonidine 0.3 mg t.i.d.  3. Dilantin 250 mg 1 tablet nightly.  4. Isosorbide 20 mg 1 tablet b.i.d.  5. Furosemide 80 mg 1 tablet b.i.d.  6. Hydralazine 1.5 tablets b.i.d.  7. Potassium chloride 20 mEq daily.  8.  Norvasc 10 mg daily.   PHYSICAL EXAMINATION:  A well-appearing adult female seated in a regular  chair.  The patient has slightly decreased affect, but answers questions  appropriately with slight delay.  Blood pressure is 114/52 with a pulse of 87, respiratory rate 18, and O2  saturation 99% on room air.  She has 4+/5 strength throughout the bilateral upper and lower  extremities.  She ambulates without any assistive device.   IMPRESSION:  1. Status post left parietal intracerebral hemorrhage.  2. Seizure disorder.  3. Hypertension.  4. Chronic renal insufficiency.   In the office today we did have the patient continue in outpatient  therapies.  Will plan on seeing her in followup in approximately 2 to 3  months' time.  Will probably begin weaning her dilantin over the next 2  to 3 months when we see her in followup as long as she remains seizure  free.  She will continue with [facility] for her primary care needs.  Her husband will be monitoring her blood pressure with a home machine,  and taking those readings in when  they see the [facility] physicians in followup.  Those physicians will  be making decision regarding referral back to [facility].  Will  plan on seeing the patient in followup as noted above.           ______________________________  Jarvis Morgan, M.D.     DC/MedQ  D:  11/05/2007 09:58:17  T:  11/05/2007  11:54:35  Job #:  [id number]

## 2011-02-19 NOTE — H&P (Signed)
[name], [name]              ACCOUNT NO.:  [id number]   MEDICAL RECORD NO.:  [id number]          PATIENT TYPE:  INP   LOCATION:  3013                         FACILITY:  [place] [facility]   PHYSICIAN:  Legrand Como L. Reynolds, M.D.DATE OF BIRTH:  06-Oct-1956   DATE OF ADMISSION:  11/19/2007  DATE OF DISCHARGE:                              HISTORY & PHYSICAL   CHIEF COMPLAINT:  Left facial drooping and new stroke.   HISTORY OF PRESENT ILLNESS:  This is the second [facility] admission for this 55 year old woman with a past medical history  which includes a left parietal hemorrhagic stroke presenting with  seizure in December of last year.  She has recovered fairly well from  that.  She also has a history of very refractory hypertension, on  several medications.  She eventually went to rehab, did well, and was  last seen by rehab physician on November 05, 2007.  It is not clear if  she has ever been seen in our office.  In any case, a few days ago, her  husband says that she had some twitching on the left side of her  mouth, then the left side of her mouth was drooping.  He felt that her  mouth wasn't  working right.  This happened 4 days ago, and it is not  clear if it is still going on as the patient says that she really never  felt this.  In any case, she came to the emergency department today to  have this evaluated.  She had a CT of the head which was unremarkable,  but an MRI of the brain demonstrated a new infarct, and stroke service  was asked to admit her for further considerations.  She says at this  time she is feeling fine.  She denies any dysphagia or any alteration of  speech or any focal findings in the extremities.   PAST HISTORY:  She was admitted to the stroke service with a left  parietal hemorrhage in December as noted above.  She had a seizure at  onset and has been kept on Dilantin since that time, although the  question of whether or not she has epilepsy is  still open.  She has very  severe and refractory hypertension.  Her previous MRI shows several  micro-bleeds.  Other than her hypertension, she does not have any known  medical history. She does have some known chronic renal insufficiency  thought due to hypertension, for which she has been seen by the renal  doctors in the past.   MEDICATIONS:  The best I can tell, her medications include labetalol 300  mg b.i.d., clonidine 0.3 mg t.i.d., Dilantin 200 mg q.h.s., isosorbide  dinitrate 20 mg b.i.d., Lasix 80 mg b.i.d., hydralazine 25 mg b.i.d.,  Norvasc 10 mg daily, and potassium 20 mEq daily.   ALLERGIES:  NO KNOWN DRUG ALLERGIES.   FAMILY HISTORY:  Remarkable for coronary artery disease.   SOCIAL HISTORY:  She lives with her husband and has worked as a Marine scientist in  the past.  She denies any history of tobacco,  alcohol, or illicit drugs.   REVIEW OF SYSTEMS:  Neurologic as above.  She denies any other recent  symptoms, and full 10-system review of systems is negative.   PHYSICAL EXAMINATION:  VITAL SIGNS:  Temperature 97.6, blood pressure  177/104, pulse 76, respirations 20, O2 sat 97% on room air.  GENERAL:  This is a healthy-appearing woman lying supine in the hospital  bed in no evident distress.  HEENT:  Head:  Cranium is normocephalic and atraumatic.  Oropharynx  benign.  NECK:  Supple without carotid or supraclavicular bruit.  HEART:  Regular rate and rhythm without murmurs.  CHEST:  Clear to auscultation bilaterally.  ABDOMEN:  Soft, normoactive bowel sounds.  EXTREMITIES:  Trace edema.  NEUROLOGIC:  She is awake and alert.  She is oriented to time, place and  person.  Recent and remote memory are intact.  Attention span,  concentration, and fund of knowledge are all appropriate.  Speech is  fluent and not dysarthric.  There are no defects to confrontational  naming, and she can repeat a phrase.  Cranial nerves:  Pupils are equal  and reactive.  The extraocular movements  full without nystagmus.  Visual  fields full to confrontation.  Hearing is intact to conversational  speech.  Face, tongue and palate move normally and symmetrically,  although there may be a little bit of a left facial droop.  Motor:  Normal bulk and tone, normal strength in all tested extremity muscles.  Sensation intact to light touch, pinprick and double stimulation in all  extremities.  Coordination:  Rapid movements  performed accurately,  perhaps a little slowly on the left.  Finger-to-nose performed  accurately.  Gait is deferred.  Reflexes 2+ and symmetric.  Toes are  downgoing bilaterally.   LABORATORY REVIEW:  CBC:  White count 6.7, hemoglobin 10, platelets  269,000.  Chemistries were unremarkable except for a BUN and creatinine  of 36 and 2.9 respectively.  Coags were normal.  MRI of the brain was  personally reviewed.  The study was remarkable for a moderate-sized  right brain subcortical stroke about a centimeter in size as well as a  punctate stroke in the frontal subcortical white matter on the left.  The sequela of the old hemorrhage is seen, but there does not appear to  be any ongoing bleeding.  Micro-bleeds are seen on the MRI as well,  which appear to be stable.   IMPRESSION:  Subacute new bland right brain stroke in a patient with  severe hypertension and a known history of previous left brain  hemorrhagic stroke.   PLAN:  We will admit to the hospital, start aspirin, as this should be a  safe  distance from her previous bleeding.  Given her bicerebral  strokes, we will check a TEE and a bubble study.  The stroke service to  follow.      Michael L. Doy Mince, M.D.  Electronically Signed     MLR/MEDQ  D:  11/19/2007  T:  11/21/2007  Job:  [id number]

## 2011-02-22 NOTE — Discharge Summary (Signed)
[facility]  Patient:    [name], [name]                       MRN: [id number] Adm. Date:  [id number] Disc. Date: [id number] Attending:  Jules Husbands Dictator:   Jeannette How, P.A.                           Discharge Summary  DISCHARGE DIAGNOSES:            1. Intrauterine pregnancy at 34+ weeks                                    gestation.                                 2. Chronic hypertension with superimposed                                    preeclampsia.                                 3. Recurrent late fetal heart rate                                    decelerations with Pitocin.  PROCEDURE:                      Primary low segment transverse cesarean section.   Surgeon:  Hazel Sams. Quincy Simmonds, M.D.  Assistant:  Cristopher Estimable. Stann Mainland, M.D. Complications: None.  HISTORY OF PRESENT ILLNESS:     This 55 year old G5, P2-0-2-2 presented at [redacted] weeks gestation for chronic hypertension with superimposed preeclampsia.  The patient was seen in the office.  Her blood pressure was elevated.  She also had proteinuria.  She is currently on Aldomet 3 grams per day and Procardia 60 mg per day.  The patient was admitted and denies any headaches or visual changes at this point.  HOSPITAL COURSE:                The patient was started on steroid protocol, and a Doppler ultrasound and AFI were ordered.  The patients liver function tests were elevated.  The patients blood pressures continued to be elevated during her hospital course.  Induction was begun on June 26, 2000.  The patient was started on Pitocin.  She started to demonstrate some recurrent late fetal heart rate decelerations despite oxygen, fluids and position changes.  Magnesium sulfate and potassium were stopped at this time, and the decelerations resolved.  At this point, this was discussed with the patient and it was decided to proceed with a cesarean section.  The magnesium sulfate would be  restarted after the delivery of the baby.  The patient was taken to the operating room on June 26, 2000, by Dr. Sharen Counter where a primary low transverse cesarean section was performed with the delivery of a 5 pound 7 ounce female infant with Apgars of 8 and 9.  Delivery went without complications.  The patient was started on her magnesium sulfate after delivery.  The patients postoperative course was still elevated by some elevated blood pressures.  She was started on labetalol 100 mg one b.i.d.  Her magnesium sulfate was stopped on postoperative day #1, and the patient started to develop some temperatures on postoperative day #2.  The patients temperatures resolved on their own.  She was felt ready for discharge on postoperative day #3.  DISCHARGE INSTRUCTIONS:          She was sent home on a regular diet, told to decrease activities.  She was given Motrin 600 mg #60 one every six hours as needed for pain.  She was also given Tylox #30 one to two every four hours as needed for pain.  She was also given labetalol 100 mg to take one t.i.d. with follow-up in the office Wednesday for a blood pressure check and to call with any complaints or problems. DD:  07/25/00 TD:  07/27/00 Job: [id number]

## 2011-02-22 NOTE — Op Note (Signed)
[facility]  Patient:    [name], [name]                       MRN: [id number] Proc. Date: 06/26/00 Adm. Date:  [id number] Attending:  Jules Husbands                           Operative Report  PREOPERATIVE DIAGNOSES:       1. Intrauterine pregnancy, at 34 plus three                                  weeks gestation.                               2. Chronic hypertension with superimposed                                  preeclampsia.                               3. Recurrent late fetal heart rate decelerations                                  with Pitocin.  POSTOPERATIVE DIAGNOSES:      1. Intrauterine pregnancy, at 34+ [redacted] weeks                                  gestation.                               2. Chronic hypertension with superimposed                                  preeclampsia.                               3. Recurrent late fetal heart rate decelerations                                  with Pitocin.  PROCEDURES:                   Primary low-segment transverse cesarean section.  SURGEON:                      Brook A. Quincy Simmonds, M.D.  ASSISTANT:                    Cristopher Estimable. Stann Mainland, M.D.  ANESTHESIA:                   Epidermoid spinal.  FLUIDS:                       IV fluids 2050 cc crystalloid.  ESTIMATED BLOOD LOSS:         800 cc.  URINE OUTPUT:                 250 cc.  COMPLICATIONS:                None.  INDICATIONS FOR SURGERY:      The patient is a 55 year old, gravida 5, para 1-1-2-2, at 54 plus three weeks gestation by last menstrual period, who was admitted for chronic hypertension and superimposed preeclampsia on June 23, 2000.  The patient had been treated with Aldomet and Procardia which had not been adequately controlling her hypertension and therefore prompted admission for further evaluation.  The patient had a 24-hour urine collection that is documented approximately 4500 g of protein.  The remainder of  the patients preeclamptic labs were unremarkable.  A decision was made to proceed with induction based on the 24-hour urine collection.  The patient received a dose of Cytotec for cervical ripening on June 25, 2000, Pitocin was begun on June 26, 2000.  At this time the patient was noted to develop recurrent late fetal heart rate decelerations.  IV fluid boluses and oxygen were administered; the patients position was changed.  The patients fetal heart rate tracing intermittently improved but then again developed recurrent late decelerations.  At this point, magnesium which had been previously started was discontinued along with the Pitocin and a late fetal heart rate decelerations resolved.  A discussion was held with the patient regarding her diagnosis and a recommendation was made to proceed with a primary low-segment transverse cesarean section based on the lack of labor and the recurrent fetal heart rate decelerations with a Pitocin induction. The risks and benefits were discussed with the patient, and she chose to proceed.  FINDINGS:                     A viable female was delivered at 10:53 with Apgars of 8 at one minute and 9 at five minutes.  The weight was 5 pounds and 7 ounces.  There were no grossly observed abnormalities.  The placenta had a normal insertion of a three-vessel cord.  The uterus was noted to have multiple fundal intramural fibroids.  The cord pH was 7.25.  SPECIMENS:                    Placenta was sent to pathology.  DESCRIPTION OF PROCEDURE:     With an IV in place, the patient was escorted from her labor and delivery suite with a Foley catheter in place.  The patient did receive a spinal anesthetic.  A epidural spinal anesthetic, and she was then placed in the supine position.  After adequate anesthesia was ensured, a Pfannenstiel incision was created sharply with a scalpel.  This was carried down to the fascia using a combination of monopolar  cautery dissection and blunt dissection.  The fascia was then scored in the midline with a scalpel and the incision was carried out bilaterally with the Mayo scissors.  The rectus muscles were separated from the overlying rectus fascia using sharp dissection, and the parietal peritoneum was identified and grasped with two snap clamps.  It was then entered sharply with the Metzenbaum scissors.  The incision was extended superiorly and inferiorly after the absence of adhesions was noted.  A Doyen retractor was placed over the lower uterine segment, and the bladder flap was created sharply with the Metzenbaum scissors.  The lower uterine segment was then incised with a scalpel in a transverse fashion.  The incision was carried out bilaterally in an upward fashion using bandage scissors.  The membranes were then ruptured with an Allis clamp and clear fluid was noted.  A hand was inserted through the uterine incision and the vertex was delivered. The remainder of the infant was then delivered, and the cord doubly clamped and cut.  The infant was then carried over to the awaiting physicians.  The patient received Pitocin 20 units IV after the placenta was manually extracted.  All of the remaining membranes were removed from within the uterine cavity using a Kelly clamp and using a moistened lap pad.  The uterine incision was then closed with #1 chromic.  The first layer was a running lock layer, and the second layer was a imbricating layer.  A additional figure-of-eight suture was placed along the midportion of the uterine incision, and locking sutures of #1 chromic were placed along the left apex of the incision in order to create excellent hemostasis.  The peritoneal cavity was irrigated and suctioned, and the uterine incision was reexamined.  There was no evidence of any ongoing bleeding noted.  The fascia was then closed with a running suture of 0 Vicryl, and the subcutaneous tissue was  irrigated with crystalloid solution and closed with interrupted sutures of 3-0 plain.  The skin was closed with staples, and a sterile bandage was placed over the incision.   The uterus was expressed of any remaining clots, and the patient was escorted to the recovery room in stable and awake condition.  There were no complications to the procedure.  All needle, instrument, and lap counts were correct. DD:  06/26/00 TD:  06/28/00 Job: [id number]

## 2011-03-21 ENCOUNTER — Emergency Department (HOSPITAL_COMMUNITY)
Admission: EM | Admit: 2011-03-21 | Discharge: 2011-03-21 | Disposition: A | Discharge status: Home / Self Care | Payer: Self-pay | Attending: Emergency Medicine | Admitting: Emergency Medicine

## 2011-03-21 DIAGNOSIS — I129 Hypertensive chronic kidney disease with stage 1 through stage 4 chronic kidney disease, or unspecified chronic kidney disease: Secondary | ICD-10-CM | POA: Insufficient documentation

## 2011-03-21 DIAGNOSIS — Z79899 Other long term (current) drug therapy: Secondary | ICD-10-CM | POA: Insufficient documentation

## 2011-03-21 DIAGNOSIS — E7800 Pure hypercholesterolemia, unspecified: Secondary | ICD-10-CM | POA: Insufficient documentation

## 2011-03-21 DIAGNOSIS — N189 Chronic kidney disease, unspecified: Secondary | ICD-10-CM | POA: Insufficient documentation

## 2011-03-21 LAB — POCT I-STAT, CHEM 8
Calcium, Ion: 1.16 mmol/L (ref 1.12–1.32)
Chloride: 109 mEq/L (ref 96–112)
HCT: 37 % (ref 36.0–46.0)
Sodium: 139 mEq/L (ref 135–145)

## 2011-06-27 LAB — BASIC METABOLIC PANEL
Calcium: 8.8
Chloride: 102
Creatinine, Ser: 2.34 — ABNORMAL HIGH
Creatinine, Ser: 2.45 — ABNORMAL HIGH
GFR calc Af Amer: 25 — ABNORMAL LOW
GFR calc Af Amer: 27 — ABNORMAL LOW
Sodium: 137
Sodium: 139

## 2011-06-28 LAB — I-STAT 8, (EC8 V) (CONVERTED LAB)
BUN: 36 — ABNORMAL HIGH
Bicarbonate: 25.6 — ABNORMAL HIGH
Chloride: 101
Glucose, Bld: 107 — ABNORMAL HIGH
Hemoglobin: 11.9 — ABNORMAL LOW
Operator id: 198171
pCO2, Ven: 42.7 — ABNORMAL LOW

## 2011-06-28 LAB — HEMOGLOBIN A1C
Hgb A1c MFr Bld: 6.7 — ABNORMAL HIGH
Mean Plasma Glucose: 161

## 2011-06-28 LAB — CBC
HCT: 31 — ABNORMAL LOW
MCHC: 32.3
MCV: 81.2
Platelets: 269
RDW: 25.4 — ABNORMAL HIGH
WBC: 6.7

## 2011-06-28 LAB — URINALYSIS, ROUTINE W REFLEX MICROSCOPIC
Glucose, UA: NEGATIVE
Hgb urine dipstick: NEGATIVE
Ketones, ur: NEGATIVE
Protein, ur: NEGATIVE
Urobilinogen, UA: 0.2

## 2011-06-28 LAB — RAPID URINE DRUG SCREEN, HOSP PERFORMED
Amphetamines: NOT DETECTED
Barbiturates: NOT DETECTED
Benzodiazepines: NOT DETECTED
Cocaine: NOT DETECTED

## 2011-06-28 LAB — DIFFERENTIAL
Basophils Absolute: 0
Eosinophils Absolute: 0.5
Lymphs Abs: 1.5
Monocytes Absolute: 1.1 — ABNORMAL HIGH
Neutrophils Relative %: 54

## 2011-06-28 LAB — URINE MICROSCOPIC-ADD ON

## 2011-06-28 LAB — LIPID PANEL
Cholesterol: 233 — ABNORMAL HIGH
LDL Cholesterol: 155 — ABNORMAL HIGH
Triglycerides: 83

## 2011-06-28 LAB — PROTIME-INR: Prothrombin Time: 13.5

## 2011-06-28 LAB — POCT I-STAT CREATININE: Creatinine, Ser: 2.9 — ABNORMAL HIGH

## 2011-07-09 LAB — POCT I-STAT, CHEM 8
BUN: 77 — ABNORMAL HIGH
Chloride: 100
Creatinine, Ser: 3.2 — ABNORMAL HIGH
Glucose, Bld: 166 — ABNORMAL HIGH
Hemoglobin: 11.9 — ABNORMAL LOW
Potassium: 4
Sodium: 135

## 2011-07-12 LAB — BASIC METABOLIC PANEL
BUN: 12
BUN: 12
BUN: 15
BUN: 9
CO2: 19
CO2: 20
CO2: 22
CO2: 25
Calcium: 7.9 — ABNORMAL LOW
Calcium: 8.2 — ABNORMAL LOW
Calcium: 8.5
Calcium: 8.8
Calcium: 8.9
Chloride: 106
Chloride: 109
Chloride: 111
Chloride: 114 — ABNORMAL HIGH
Creatinine, Ser: 1.96 — ABNORMAL HIGH
Creatinine, Ser: 2.42 — ABNORMAL HIGH
Creatinine, Ser: 2.6 — ABNORMAL HIGH
Creatinine, Ser: 2.72 — ABNORMAL HIGH
Creatinine, Ser: 2.73 — ABNORMAL HIGH
GFR calc Af Amer: 24 — ABNORMAL LOW
GFR calc Af Amer: 26 — ABNORMAL LOW
GFR calc Af Amer: 30 — ABNORMAL LOW
GFR calc Af Amer: 33 — ABNORMAL LOW
GFR calc non Af Amer: 17 — ABNORMAL LOW
GFR calc non Af Amer: 18 — ABNORMAL LOW
GFR calc non Af Amer: 18 — ABNORMAL LOW
GFR calc non Af Amer: 19 — ABNORMAL LOW
GFR calc non Af Amer: 21 — ABNORMAL LOW
GFR calc non Af Amer: 23 — ABNORMAL LOW
Glucose, Bld: 116 — ABNORMAL HIGH
Glucose, Bld: 121 — ABNORMAL HIGH
Glucose, Bld: 125 — ABNORMAL HIGH
Glucose, Bld: 138 — ABNORMAL HIGH
Potassium: 2.9 — ABNORMAL LOW
Potassium: 3.1 — ABNORMAL LOW
Potassium: 3.6
Potassium: 4
Sodium: 138
Sodium: 139
Sodium: 142
Sodium: 145

## 2011-07-12 LAB — URINALYSIS, ROUTINE W REFLEX MICROSCOPIC
Bilirubin Urine: NEGATIVE
Bilirubin Urine: NEGATIVE
Bilirubin Urine: NEGATIVE
Glucose, UA: 100 — AB
Ketones, ur: NEGATIVE
Nitrite: NEGATIVE
Nitrite: NEGATIVE
Specific Gravity, Urine: 1.015
Specific Gravity, Urine: 1.017
Urobilinogen, UA: 0.2
pH: 5.5
pH: 5.5
pH: 6

## 2011-07-12 LAB — CROSSMATCH
ABO/RH(D): O POS
Antibody Screen: NEGATIVE

## 2011-07-12 LAB — DIFFERENTIAL
Basophils Absolute: 0
Eosinophils Absolute: 0.4
Lymphs Abs: 0.9
Neutrophils Relative %: 83 — ABNORMAL HIGH

## 2011-07-12 LAB — CBC
HCT: 23.3 — ABNORMAL LOW
HCT: 26.6 — ABNORMAL LOW
HCT: 27.7 — ABNORMAL LOW
HCT: 29.7 — ABNORMAL LOW
Hemoglobin: 8.4 — ABNORMAL LOW
Hemoglobin: 8.4 — ABNORMAL LOW
Hemoglobin: 9.1 — ABNORMAL LOW
Hemoglobin: 9.7 — ABNORMAL LOW
MCHC: 31.4
MCHC: 31.5
MCHC: 31.7
MCHC: 31.7
MCHC: 32.5
MCHC: 32.6
MCV: 71.6 — ABNORMAL LOW
MCV: 72.3 — ABNORMAL LOW
MCV: 72.3 — ABNORMAL LOW
MCV: 72.7 — ABNORMAL LOW
MCV: 76.3 — ABNORMAL LOW
Platelets: 217
Platelets: 256
Platelets: 399
Platelets: 475 — ABNORMAL HIGH
Platelets: 519 — ABNORMAL HIGH
RBC: 3.24 — ABNORMAL LOW
RBC: 3.66 — ABNORMAL LOW
RBC: 3.89
RBC: 3.99
RDW: 18.2 — ABNORMAL HIGH
RDW: 18.7 — ABNORMAL HIGH
RDW: 19.4 — ABNORMAL HIGH
RDW: 19.8 — ABNORMAL HIGH
RDW: 23.1 — ABNORMAL HIGH
WBC: 13.2 — ABNORMAL HIGH
WBC: 15.4 — ABNORMAL HIGH
WBC: 4.4
WBC: 7.7
WBC: 8.5

## 2011-07-12 LAB — COMPREHENSIVE METABOLIC PANEL
ALT: 11
ALT: 35
AST: 23
Albumin: 2.3 — ABNORMAL LOW
Alkaline Phosphatase: 44
Alkaline Phosphatase: 45
BUN: 10
BUN: 9
CO2: 22
CO2: 23
Calcium: 7.5 — ABNORMAL LOW
Calcium: 8.9
Chloride: 108
Creatinine, Ser: 2.64 — ABNORMAL HIGH
GFR calc Af Amer: 23 — ABNORMAL LOW
GFR calc non Af Amer: 19 — ABNORMAL LOW
GFR calc non Af Amer: 25 — ABNORMAL LOW
Glucose, Bld: 125 — ABNORMAL HIGH
Glucose, Bld: 131 — ABNORMAL HIGH
Potassium: 3.7
Sodium: 138
Sodium: 142
Total Bilirubin: 0.2 — ABNORMAL LOW
Total Protein: 6.3
Total Protein: 6.4

## 2011-07-12 LAB — CULTURE, BLOOD (ROUTINE X 2): Culture: NO GROWTH

## 2011-07-12 LAB — IRON AND TIBC
Saturation Ratios: 9 — ABNORMAL LOW
TIBC: 278
UIBC: 254

## 2011-07-12 LAB — RENAL FUNCTION PANEL
Albumin: 2.6 — ABNORMAL LOW
BUN: 14
BUN: 8
CO2: 20
CO2: 26
Calcium: 8.8
Chloride: 104
Chloride: 108
Creatinine, Ser: 2.24 — ABNORMAL HIGH
Creatinine, Ser: 2.68 — ABNORMAL HIGH
GFR calc Af Amer: 28 — ABNORMAL LOW
GFR calc non Af Amer: 23 — ABNORMAL LOW
Glucose, Bld: 116 — ABNORMAL HIGH
Glucose, Bld: 133 — ABNORMAL HIGH
Phosphorus: 3.9
Potassium: 3.7
Potassium: 3.9
Sodium: 138

## 2011-07-12 LAB — TYPE AND SCREEN
ABO/RH(D): O POS
Antibody Screen: NEGATIVE

## 2011-07-12 LAB — UIFE/LIGHT CHAINS/TP QN, 24-HR UR
Albumin, U: DETECTED
Alpha 1, Urine: DETECTED — AB
Alpha 2, Urine: DETECTED — AB
Beta, Urine: DETECTED — AB
Free Kappa Lt Chains,Ur: 40.9 — ABNORMAL HIGH (ref 0.04–1.51)
Free Lambda Lt Chains,Ur: 6.74 — ABNORMAL HIGH (ref 0.08–1.01)
Gamma Globulin, Urine: DETECTED — AB
Total Protein, Urine: 159.6

## 2011-07-12 LAB — URINE MICROSCOPIC-ADD ON

## 2011-07-12 LAB — URINE CULTURE: Colony Count: NO GROWTH

## 2011-07-12 LAB — BLOOD GAS, ARTERIAL
Acid-base deficit: 3.9 — ABNORMAL HIGH
Bicarbonate: 20
FIO2: 0.3
TCO2: 21
pCO2 arterial: 33.3 — ABNORMAL LOW
pH, Arterial: 7.399

## 2011-07-12 LAB — HEMOGLOBIN: Hemoglobin: 9.7 — ABNORMAL LOW

## 2011-07-12 LAB — HEMOGLOBIN A1C: Mean Plasma Glucose: 147

## 2011-07-12 LAB — ALBUMIN: Albumin: 2.4 — ABNORMAL LOW

## 2011-07-12 LAB — PROTEIN, URINE, RANDOM: Total Protein, Urine: 413

## 2011-07-12 LAB — PROTEIN ELECTROPH W RFLX QUANT IMMUNOGLOBULINS
Alpha-2-Globulin: 16.6 — ABNORMAL HIGH
M-Spike, %: NOT DETECTED
Total Protein ELP: 7

## 2011-07-12 LAB — PTH, INTACT AND CALCIUM
Calcium, Total (PTH): 8.4
PTH: 95.1 — ABNORMAL HIGH

## 2011-07-12 LAB — PHENYTOIN LEVEL, TOTAL: Phenytoin Lvl: 10.8

## 2011-07-12 LAB — CREATININE, URINE, RANDOM: Creatinine, Urine: 126.9

## 2011-07-15 LAB — APTT
aPTT: 29
aPTT: 30

## 2011-07-15 LAB — CARDIAC PANEL(CRET KIN+CKTOT+MB+TROPI)
CK, MB: 1.9
Relative Index: 1
Total CK: 194 — ABNORMAL HIGH
Total CK: 208 — ABNORMAL HIGH
Troponin I: 0.73
Troponin I: 0.87

## 2011-07-15 LAB — DIFFERENTIAL
Basophils Absolute: 0.1
Basophils Relative: 1
Lymphocytes Relative: 36
Monocytes Absolute: 0.6
Neutro Abs: 6.2

## 2011-07-15 LAB — CBC
Hemoglobin: 9.4 — ABNORMAL LOW
MCHC: 31.4
MCV: 72.5 — ABNORMAL LOW
RBC: 4.11
RBC: 4.85
RDW: 18.3 — ABNORMAL HIGH
WBC: 11.1 — ABNORMAL HIGH

## 2011-07-15 LAB — COMPREHENSIVE METABOLIC PANEL
Albumin: 3.9
Alkaline Phosphatase: 54
BUN: 13
CO2: 23
Calcium: 8.3 — ABNORMAL LOW
Calcium: 8.6
Creatinine, Ser: 1.84 — ABNORMAL HIGH
GFR calc Af Amer: 41 — ABNORMAL LOW
GFR calc non Af Amer: 29 — ABNORMAL LOW
GFR calc non Af Amer: 34 — ABNORMAL LOW
Glucose, Bld: 113 — ABNORMAL HIGH
Sodium: 137
Sodium: 139
Total Bilirubin: 1.1
Total Protein: 6.9
Total Protein: 8.7 — ABNORMAL HIGH

## 2011-07-15 LAB — PROTIME-INR
INR: 1
Prothrombin Time: 12.5
Prothrombin Time: 13.4

## 2011-07-15 LAB — POCT I-STAT 3, ART BLOOD GAS (G3+)
Acid-Base Excess: 3 — ABNORMAL HIGH
O2 Saturation: 100
TCO2: 30
pCO2 arterial: 42.9
pO2, Arterial: 561 — ABNORMAL HIGH

## 2011-07-15 LAB — CK TOTAL AND CKMB (NOT AT ARMC)
CK, MB: 2.7
Relative Index: 1.8
Total CK: 152

## 2011-07-15 LAB — LIPID PANEL
Cholesterol: 193
LDL Cholesterol: 101 — ABNORMAL HIGH
Total CHOL/HDL Ratio: 2.3

## 2011-07-15 LAB — TROPONIN I: Troponin I: 0.26 — ABNORMAL HIGH

## 2012-06-13 ENCOUNTER — Encounter (HOSPITAL_COMMUNITY): Payer: Self-pay | Admitting: Emergency Medicine

## 2012-06-13 ENCOUNTER — Emergency Department (HOSPITAL_COMMUNITY)
Admission: EM | Admit: 2012-06-13 | Discharge: 2012-06-13 | Disposition: A | Discharge status: Home / Self Care | Payer: Self-pay | Source: Home / Self Care | Attending: Emergency Medicine | Admitting: Emergency Medicine

## 2012-06-13 DIAGNOSIS — I10 Essential (primary) hypertension: Secondary | ICD-10-CM

## 2012-06-13 DIAGNOSIS — N189 Chronic kidney disease, unspecified: Secondary | ICD-10-CM

## 2012-06-13 DIAGNOSIS — J309 Allergic rhinitis, unspecified: Secondary | ICD-10-CM

## 2012-06-13 HISTORY — DX: Essential (primary) hypertension: I10

## 2012-06-13 LAB — POCT I-STAT, CHEM 8
Calcium, Ion: 1.25 mmol/L — ABNORMAL HIGH (ref 1.12–1.23)
Chloride: 110 mEq/L (ref 96–112)
Creatinine, Ser: 2 mg/dL — ABNORMAL HIGH (ref 0.50–1.10)
Glucose, Bld: 105 mg/dL — ABNORMAL HIGH (ref 70–99)
HCT: 38 % (ref 36.0–46.0)
Hemoglobin: 12.9 g/dL (ref 12.0–15.0)
Potassium: 4.5 mEq/L (ref 3.5–5.1)

## 2012-06-13 LAB — PHENYTOIN LEVEL, TOTAL: Phenytoin Lvl: 3.7 ug/mL — ABNORMAL LOW (ref 10.0–20.0)

## 2012-06-13 MED ORDER — PHENYTOIN SODIUM EXTENDED 100 MG PO CAPS: 100.0000 mg | ORAL_CAPSULE | Freq: Three times a day (TID) | ORAL | Status: DC

## 2012-06-13 MED ORDER — AMLODIPINE BESYLATE 10 MG PO TABS: 10.0000 mg | ORAL_TABLET | Freq: Every day | ORAL | Status: DC

## 2012-06-13 MED ORDER — LISINOPRIL 20 MG PO TABS: ORAL_TABLET | ORAL | Status: DC

## 2012-06-13 MED ORDER — LABETALOL HCL 200 MG PO TABS: ORAL_TABLET | ORAL | Status: DC

## 2012-06-13 MED ORDER — ISOSORB DINITRATE-HYDRALAZINE 20-37.5 MG PO TABS: 1.0000 | ORAL_TABLET | Freq: Three times a day (TID) | ORAL | Status: DC

## 2012-06-13 MED ORDER — FOLIC ACID 1 MG PO TABS: 1.0000 mg | ORAL_TABLET | Freq: Every day | ORAL | Status: AC

## 2012-06-13 MED ORDER — CETIRIZINE HCL 10 MG PO TABS: 10.0000 mg | ORAL_TABLET | Freq: Every day | ORAL | Status: DC

## 2012-06-13 NOTE — ED Notes (Signed)
Pt c/o high blood pressure... She denies: SOB, headaches, blurry vision, chest pains... Needing refill on meds due to [facility] closure.

## 2012-06-13 NOTE — ED Provider Notes (Signed)
Chief Complaint  Patient presents with  . Hypertension    History of Present Illness:  [name] is a 56 year old female, a patient at [facility]. She's been treated there for high blood pressure, seizures, allergies, and hyperlipidemia. She states she has had high blood pressure for years. She is on 4 different medications for it. He's been off all meds for the past week since she ran out and could not get them refilled. She denies any headaches, dizziness, lightheadedness, blurry vision, shortness of breath, chest pain, tightness, pressure, palpitations, syncope, or ankle edema. She has had no side effects from medications. She does try to avoid salt and sodium. She has not been checking her blood pressure this past week. She has no history of kidney disease, diabetes, or strokelike symptoms. She did have a stroke in 2008 and apparently had a seizure at that time. She's been on Dilantin 100 mg 2 at bedtime since then. She had some blood work done in May but is not sure whether she had a Dilantin level done. She has never had a seizure since her CVA. She also takes cetirizine and Flonase nasal spray for allergies and is on folic acid 1 mg per day. Her blood pressure meds include amlodipine 10 mg once daily, labetalol 200 mg one and half tablets twice daily, lisinopril 20 mg 2 daily, and she also takes BiDil 20/37.5 mg 3 times daily. She also takes medication for hyperlipidemia, but she could not remember the name of the medication or the dosage.  Review of Systems:  Other than noted above, the patient denies any of the following symptoms: Systemic:  No fever, chills, fatigue, weight loss or gain. Respiratory:  No coughing, wheezing, or shortness of breath. Cardiac:  No chest pain, tightness, pressure, palpitations, syncope, or edema. Neuro:  No headache, dizziness, blurred vision, weakness, paresthesias, or strokelike symptoms.  [place]:  Past medical history, family history, social  history, meds, and allergies were reviewed.  Physical Exam:   Vital signs:  BP 199/91  Pulse 70  Temp 98.2 F (36.8 C) (Oral)  Resp 18  SpO2 100% General:  Alert, oriented, in no distress. Lungs:  Breath sounds clear and equal bilaterally.  No wheezes, rales, or rhonchi. Heart:  Regular rhythm, no gallops, murmers, clicks or rubs.  Abdomen:  Soft and flat.  Nontender, no organomegaly or mass.  No pulsatile midline abdominal mass or bruit. Ext:  No edema, pulses full.  Labs:   Results for orders placed during the hospital encounter of 06/13/12  PHENYTOIN LEVEL, TOTAL      Component Value Range   Phenytoin Lvl 3.7 (*) 10.0 - 20.0 ug/mL  POCT I-STAT, CHEM 8      Component Value Range   Sodium 143  135 - 145 mEq/L   Potassium 4.5  3.5 - 5.1 mEq/L   Chloride 110  96 - 112 mEq/L   BUN 30 (*) 6 - 23 mg/dL   Creatinine, Ser 2.00 (*) 0.50 - 1.10 mg/dL   Glucose, Bld 105 (*) 70 - 99 mg/dL   Calcium, Ion 1.25 (*) 1.12 - 1.23 mmol/L   TCO2 22  0 - 100 mmol/L   Hemoglobin 12.9  12.0 - 15.0 g/dL   HCT 38.0  36.0 - 46.0 %    Assessment:  The primary encounter diagnosis was Hypertension. Diagnoses of Chronic kidney disease and Allergic rhinitis were also pertinent to this visit.  Plan:   1.  The following meds were prescribed:  New Prescriptions   AMLODIPINE (NORVASC) 10 MG TABLET    Take 1 tablet (10 mg total) by mouth daily.   CETIRIZINE (ZYRTEC) 10 MG TABLET    Take 1 tablet (10 mg total) by mouth daily.   FOLIC ACID (FOLVITE) 1 MG TABLET    Take 1 tablet (1 mg total) by mouth daily.   ISOSORBIDE-HYDRALAZINE (BIDIL) 20-37.5 MG PER TABLET    Take 1 tablet by mouth 3 (three) times daily.   LABETALOL (NORMODYNE) 200 MG TABLET    1 and 1/2 tablet BID.   LISINOPRIL (PRINIVIL,ZESTRIL) 20 MG TABLET    Take 2 tablets daily.   PHENYTOIN (DILANTIN) 100 MG ER CAPSULE    Take 1 capsule (100 mg total) by mouth 3 (three) times daily.   2.  The patient was instructed in symptomatic care and  handouts were given. 3.  The patient was told to return if becoming worse in any way, if no better in 3 or 4 days, and given some red flag symptoms that would indicate earlier return.  Follow up:  The patient was told to follow up with a primary care physician as soon as possible.     Harden Mo, MD 06/13/12 2049

## 2012-06-13 NOTE — ED Notes (Addendum)
MD keller informed of Pt's high BP

## 2012-06-18 LAB — PHENYTOIN LEVEL, FREE AND TOTAL

## 2012-06-18 NOTE — ED Notes (Signed)
Phenytoin level 3.7 L, Phenytoin Free 3.3 L .  Labs shown to Dr. Jake Michaelis.  He said pt. told him he has been taking it.  He increased pt.'s Dilantin to TID. Roselyn Meier 06/18/2012

## 2012-06-26 MED ORDER — ISOSORBIDE DINITRATE 20 MG PO TABS: 20.0000 mg | ORAL_TABLET | Freq: Three times a day (TID) | ORAL | Status: DC

## 2012-06-26 MED ORDER — ISOSORB DINITRATE-HYDRALAZINE 20-37.5 MG PO TABS: 1.0000 | ORAL_TABLET | Freq: Three times a day (TID) | ORAL | Status: DC

## 2012-06-26 MED ORDER — HYDRALAZINE HCL 25 MG PO TABS: ORAL_TABLET | ORAL | Status: DC

## 2012-06-26 NOTE — ED Notes (Signed)
Rx for more cost effective RX called to [place], [place] by Dr Jake Michaelis

## 2012-06-27 MED ORDER — ISOSORBIDE DINITRATE 20 MG PO TABS: 20.0000 mg | ORAL_TABLET | Freq: Three times a day (TID) | ORAL | Status: DC

## 2012-06-27 NOTE — ED Notes (Signed)
[facility] called questioning scripts.  Dr Jake Michaelis spoke to [facility] directly about ordered medicines

## 2014-01-06 ENCOUNTER — Emergency Department (HOSPITAL_COMMUNITY)
Admission: EM | Admit: 2014-01-06 | Discharge: 2014-01-06 | Disposition: A | Discharge status: Home / Self Care | Payer: Self-pay | Attending: Emergency Medicine | Admitting: Emergency Medicine

## 2014-01-06 ENCOUNTER — Emergency Department (HOSPITAL_COMMUNITY): Payer: Self-pay

## 2014-01-06 ENCOUNTER — Encounter (HOSPITAL_COMMUNITY): Payer: Self-pay | Admitting: Emergency Medicine

## 2014-01-06 DIAGNOSIS — Z79899 Other long term (current) drug therapy: Secondary | ICD-10-CM | POA: Insufficient documentation

## 2014-01-06 DIAGNOSIS — R51 Headache: Secondary | ICD-10-CM | POA: Insufficient documentation

## 2014-01-06 DIAGNOSIS — J329 Chronic sinusitis, unspecified: Secondary | ICD-10-CM | POA: Insufficient documentation

## 2014-01-06 DIAGNOSIS — I10 Essential (primary) hypertension: Secondary | ICD-10-CM | POA: Insufficient documentation

## 2014-01-06 LAB — CBC WITH DIFFERENTIAL/PLATELET
Basophils Absolute: 0 10*3/uL (ref 0.0–0.1)
Basophils Relative: 0 % (ref 0–1)
EOS PCT: 5 % (ref 0–5)
Eosinophils Absolute: 0.2 10*3/uL (ref 0.0–0.7)
HCT: 34.2 % — ABNORMAL LOW (ref 36.0–46.0)
Hemoglobin: 11.5 g/dL — ABNORMAL LOW (ref 12.0–15.0)
LYMPHS ABS: 1.7 10*3/uL (ref 0.7–4.0)
LYMPHS PCT: 41 % (ref 12–46)
MCH: 30 pg (ref 26.0–34.0)
MCHC: 33.6 g/dL (ref 30.0–36.0)
MCV: 89.3 fL (ref 78.0–100.0)
Monocytes Absolute: 0.4 10*3/uL (ref 0.1–1.0)
Monocytes Relative: 10 % (ref 3–12)
NEUTROS PCT: 44 % (ref 43–77)
Neutro Abs: 1.8 10*3/uL (ref 1.7–7.7)
PLATELETS: 159 10*3/uL (ref 150–400)
RBC: 3.83 MIL/uL — AB (ref 3.87–5.11)
RDW: 13.7 % (ref 11.5–15.5)
WBC: 4.1 10*3/uL (ref 4.0–10.5)

## 2014-01-06 LAB — BASIC METABOLIC PANEL
BUN: 27 mg/dL — ABNORMAL HIGH (ref 6–23)
CHLORIDE: 105 meq/L (ref 96–112)
CO2: 23 meq/L (ref 19–32)
Calcium: 9.6 mg/dL (ref 8.4–10.5)
Creatinine, Ser: 1.84 mg/dL — ABNORMAL HIGH (ref 0.50–1.10)
GFR calc Af Amer: 34 mL/min — ABNORMAL LOW (ref >90)
GFR calc non Af Amer: 30 mL/min — ABNORMAL LOW (ref >90)
GLUCOSE: 113 mg/dL — AB (ref 70–99)
POTASSIUM: 3.8 meq/L (ref 3.7–5.3)
SODIUM: 142 meq/L (ref 137–147)

## 2014-01-06 LAB — TROPONIN I: Troponin I: <0.3 ng/mL (ref <0.30)

## 2014-01-06 MED ORDER — HYDROCODONE-ACETAMINOPHEN 5-325 MG PO TABS: 2.0000 | ORAL_TABLET | ORAL | Status: DC | PRN

## 2014-01-06 MED ORDER — HYDRALAZINE HCL 20 MG/ML IJ SOLN
20.0000 mg | Freq: Once | INTRAMUSCULAR | Status: AC
  Administered 2014-01-06: 20 mg via INTRAVENOUS
  Filled 2014-01-06: qty 1

## 2014-01-06 MED ORDER — AMOXICILLIN 500 MG PO CAPS: 500.0000 mg | ORAL_CAPSULE | Freq: Three times a day (TID) | ORAL | Status: DC

## 2014-01-06 NOTE — ED Notes (Addendum)
Pt sts she is having sinus pressure. apologized to pt and made aware of wait and delay.

## 2014-01-06 NOTE — Discharge Instructions (Signed)

## 2014-01-06 NOTE — ED Notes (Signed)
Patient transported to CT 

## 2014-01-06 NOTE — ED Notes (Signed)
Reports going to pcp for physical today, was sent here due to recent high bp, reports sbp >200. At triage, bp is 167/97. Reports having headache and sinus congestion.

## 2014-01-06 NOTE — ED Provider Notes (Signed)
CSN: [id number]     Arrival date & time 01/06/14  1441 History   First MD Initiated Contact with Patient 01/06/14 1747     Chief Complaint  Patient presents with  . Hypertension  . Headache     (Consider location/radiation/quality/duration/timing/severity/associated sxs/prior Treatment) HPI Comments: Patient presents to the ER for evaluation of elevated blood pressure. Patient went to the doctor today for nasal congestion and headache, slight nonproductive cough. Blood pressure was markedly elevated and was referred to the ER. Blood pressure was reportedly about [id number] systolic. Patient admits to not taking her medicine this morning, to take it before coming to the ER.  Patient has not spent any chest pain or shortness of breath. He does not describe the pain as a headache, but thinks it's more in the sinuses in the frontal region of her head. She does not have a sore throat, fever, vomiting or diarrhea.  Patient is a 57 y.o. female presenting with hypertension and headaches.  Hypertension Associated symptoms include headaches.  Headache Associated symptoms: congestion and cough     Past Medical History  Diagnosis Date  . Hypertension    History reviewed. No pertinent past surgical history. History reviewed. No pertinent family history. History  Substance Use Topics  . Smoking status: Not on file  . Smokeless tobacco: Not on file  . Alcohol Use: No   OB History   Grav Para Term Preterm Abortions TAB SAB Ect Mult Living                 Review of Systems  HENT: Positive for congestion.   Respiratory: Positive for cough.   Neurological: Positive for headaches.  All other systems reviewed and are negative.      Allergies  Review of patient's allergies indicates no known allergies.  Home Medications   Current Outpatient Rx  Name  Route  Sig  Dispense  Refill  . cetirizine (ZYRTEC) 10 MG tablet   Oral   Take 10 mg by mouth daily.         . hydrochlorothiazide  (HYDRODIURIL) 25 MG tablet   Oral   Take 25 mg by mouth daily.         . isosorbide dinitrate (ISORDIL) 20 MG tablet   Oral   Take 1 tablet (20 mg total) by mouth 3 (three) times daily.   90 tablet   2   . labetalol (NORMODYNE) 200 MG tablet   Oral   Take 200 mg by mouth 2 (two) times daily.         . hydrALAZINE (APRESOLINE) 50 MG tablet   Oral   Take 50 mg by mouth 3 (three) times daily.          BP 158/99  Pulse 62  Temp(Src) 98.2 F (36.8 C) (Oral)  Resp 16  Ht 5\' 6"  (1.676 m)  Wt 204 lb (92.534 kg)  BMI 32.94 kg/m2  SpO2 96% Physical Exam  Constitutional: She is oriented to person, place, and time. She appears well-developed and well-nourished. No distress.  HENT:  Head: Normocephalic and atraumatic.  Right Ear: Hearing normal.  Left Ear: Hearing normal.  Nose: Nose normal.  Mouth/Throat: Oropharynx is clear and moist and mucous membranes are normal.  Eyes: Conjunctivae and EOM are normal. Pupils are equal, round, and reactive to light.  Neck: Normal range of motion. Neck supple.  Cardiovascular: Regular rhythm, S1 normal and S2 normal.  Exam reveals no gallop and no friction rub.   No murmur  heard. Pulmonary/Chest: Effort normal and breath sounds normal. No respiratory distress. She exhibits no tenderness.  Abdominal: Soft. Normal appearance and bowel sounds are normal. There is no hepatosplenomegaly. There is no tenderness. There is no rebound, no guarding, no tenderness at McBurney's point and negative Murphy's sign. No hernia.  Musculoskeletal: Normal range of motion.  Neurological: She is alert and oriented to person, place, and time. She has normal strength. No cranial nerve deficit or sensory deficit. Coordination normal. GCS eye subscore is 4. GCS verbal subscore is 5. GCS motor subscore is 6.  Skin: Skin is warm, dry and intact. No rash noted. No cyanosis.  Psychiatric: She has a normal mood and affect. Her speech is normal and behavior is normal.  Thought content normal.    ED Course  Procedures (including critical care time) Labs Review Labs Reviewed  CBC WITH DIFFERENTIAL - Abnormal; Notable for the following:    RBC 3.83 (*)    Hemoglobin 11.5 (*)    HCT 34.2 (*)    All other components within normal limits  BASIC METABOLIC PANEL - Abnormal; Notable for the following:    Glucose, Bld 113 (*)    BUN 27 (*)    Creatinine, Ser 1.84 (*)    GFR calc non Af Amer 30 (*)    GFR calc Af Amer 34 (*)    All other components within normal limits  TROPONIN I   Imaging Review Ct Head Wo Contrast  01/06/2014   CLINICAL DATA:  Headache.  Hypertension.  EXAM: CT HEAD WITHOUT CONTRAST  TECHNIQUE: Contiguous axial images were obtained from the base of the skull through the vertex without intravenous contrast.  COMPARISON:  CT HEAD W/O CM dated 07/20/2009  FINDINGS: Extensive patchy chronic ischemic changes in the periventricular white matter. No mass effect, midline shift, or acute intracranial hemorrhage. Mastoid air cells are clear. Fluid level in the right maxillary sinus. Cranium is intact.  IMPRESSION: Chronic ischemic changes.  No acute intracranial pathology.   Electronically Signed   By: Maryclare Bean M.D.   On: 01/06/2014 19:28     EKG Interpretation   Date/Time:  Thursday January 06 2014 18:25:32 EDT Ventricular Rate:  63 PR Interval:  167 QRS Duration: 109 QT Interval:  465 QTC Calculation: 476 R Axis:   -59 Text Interpretation:  Sinus rhythm Consider left atrial enlargement Left  anterior fascicular block Anterior infarct, old progressively worsening  LAD over last few ECG, o/w no change Confirmed by Raliyah Montella  MD, Tysheka Fanguy  [phone]) on 01/06/2014 9:32:55 PM      MDM   Final diagnoses:  None   Patient sent to the ER for evaluation of headache and elevated blood pressure. Patient does have a history of significant hypertension requiring hydrochlorothiazide, labetalol and hydralazine as an outpatient. She had not taken her  morning meds on her initial pressure was taken. She did go home and take her medicines with some improvement. During her evaluation here, blood pressure crept back up again, was given additional hydralazine. Her cardiac workup is negative. Renal function is normal. Head CT does not show any acute abnormality. Patient has been experiencing sinus congestion associated with headache and this is likely sinus related. He does have an air-fluid level in the right maxillary sinus, we'll treat for acute sinusitis, continue current treatment for hypertension followup with primary doctor.    Orpah Greek, MD 01/06/14 2134

## 2018-01-19 MED FILL — LABETALOL HCL 200 MG TABLET: 200 | 30 days supply | Qty: 60 | Fill #0

## 2018-01-26 MED FILL — ISOSORBIDE DN 20 MG TABLET: 20 | 30 days supply | Qty: 90 | Fill #0

## 2018-02-04 MED FILL — AMLODIPINE BESYLATE 10 MG T: 10 | 30 days supply | Qty: 30 | Fill #0

## 2018-02-04 MED FILL — cloNIDine HCL 0.1 MG TABS: 0.1 | 30 days supply | Qty: 60 | Fill #0

## 2018-02-28 ENCOUNTER — Ambulatory Visit (HOSPITAL_COMMUNITY): Admission: EM | Admit: 2018-02-28 | Discharge: 2018-02-28 | Discharge status: Absent without leave | Payer: Self-pay

## 2018-03-03 MED FILL — cloNIDine HCL 0.1 MG TABS: 0.1 | 30 days supply | Qty: 60 | Fill #1

## 2018-03-03 MED FILL — LABETALOL HCL 200 MG TABLET: 200 | 30 days supply | Qty: 60 | Fill #0

## 2018-03-03 MED FILL — AMLODIPINE BESYLATE 10 MG T: 10 | 30 days supply | Qty: 30 | Fill #1

## 2018-03-03 MED FILL — ISOSORBIDE DN 20 MG TABLET: 20 | 30 days supply | Qty: 90 | Fill #1

## 2018-03-27 ENCOUNTER — Ambulatory Visit (INDEPENDENT_AMBULATORY_CARE_PROVIDER_SITE_OTHER): Payer: Self-pay | Admitting: Physician Assistant

## 2018-04-03 MED FILL — LABETALOL HCL 200 MG TABLET: 200 | 30 days supply | Qty: 60 | Fill #1

## 2018-04-10 MED FILL — cloNIDine HCL 0.1 MG TABS: 0.1 | 30 days supply | Qty: 60 | Fill #2

## 2018-04-10 MED FILL — AMLODIPINE BESYLATE 10 MG T: 10 | 30 days supply | Qty: 30 | Fill #2

## 2018-05-01 MED FILL — ISOSORBIDE DN 20 MG TABLET: 20 | 30 days supply | Qty: 90 | Fill #2

## 2018-05-08 MED FILL — LABETALOL HCL 200 MG TABLET: 200 | 30 days supply | Qty: 60 | Fill #1

## 2018-05-15 ENCOUNTER — Ambulatory Visit: Payer: Self-pay | Attending: Internal Medicine

## 2018-05-15 ENCOUNTER — Encounter: Payer: Self-pay | Admitting: Internal Medicine

## 2018-05-15 ENCOUNTER — Ambulatory Visit: Payer: Self-pay | Attending: Internal Medicine | Admitting: Internal Medicine

## 2018-05-15 VITALS — BP 176/97 | HR 56 | Temp 98.2°F | Resp 16 | Ht 61.5 in | Wt 223.6 lb

## 2018-05-15 DIAGNOSIS — Z1159 Encounter for screening for other viral diseases: Secondary | ICD-10-CM | POA: Insufficient documentation

## 2018-05-15 DIAGNOSIS — Z124 Encounter for screening for malignant neoplasm of cervix: Secondary | ICD-10-CM | POA: Insufficient documentation

## 2018-05-15 DIAGNOSIS — I10 Essential (primary) hypertension: Secondary | ICD-10-CM | POA: Insufficient documentation

## 2018-05-15 DIAGNOSIS — Z8673 Personal history of transient ischemic attack (TIA), and cerebral infarction without residual deficits: Secondary | ICD-10-CM | POA: Insufficient documentation

## 2018-05-15 DIAGNOSIS — F329 Major depressive disorder, single episode, unspecified: Secondary | ICD-10-CM | POA: Insufficient documentation

## 2018-05-15 DIAGNOSIS — Z114 Encounter for screening for human immunodeficiency virus [HIV]: Secondary | ICD-10-CM

## 2018-05-15 MED ORDER — AMLODIPINE BESYLATE 10 MG PO TABS: 10.0000 mg | ORAL_TABLET | Freq: Every day | ORAL | 6 refills | Status: DC

## 2018-05-15 MED ORDER — CLONIDINE HCL 0.1 MG PO TABS: 0.1000 mg | ORAL_TABLET | Freq: Two times a day (BID) | ORAL | 6 refills | Status: DC

## 2018-05-15 MED ORDER — ISOSORBIDE DINITRATE 20 MG PO TABS: 20.0000 mg | ORAL_TABLET | Freq: Three times a day (TID) | ORAL | 6 refills | Status: DC

## 2018-05-15 MED ORDER — LABETALOL HCL 200 MG PO TABS: 200.0000 mg | ORAL_TABLET | Freq: Two times a day (BID) | ORAL | 6 refills | Status: DC

## 2018-05-15 MED FILL — AMLODIPINE BESYLATE 10 MG T: 10 | 30 days supply | Qty: 30 | Fill #0

## 2018-05-15 MED FILL — cloNIDine HCL 0.1 MG TABS: 0.1 | 30 days supply | Qty: 60 | Fill #0

## 2018-05-15 NOTE — Progress Notes (Signed)
Patient ID: [name], female    DOB: 1956/05/13  MRN: [id number]  CC: New Patient (Initial Visit)   Subjective: [name] is a 62 y.o. female who presents for new patient visit and to establish with me as PCP. Her concerns today include:  Patient with history of HTN, CVA 2009  Previous PCP was at [facility]. Last seen several mths ago. She decided to leave because on last visit they drew blood in non-fasting state and she did not like that.  HTN/CVA: on Norvasc, Labetalol, Clonidine and Isosorbide.  She has been out of Norvasc x 1 wk Checks BP occasionally Limits salt in foods No CP/SOB/palpitations.  Little swelling in LLE. -not on ASA.  Reports spitting up blood with clots last yr after taking aspirin for 2 days.  Reports being told by her previous physician that she should be on aspirin  Complains of feeling depressed at times.  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  She has not worked since then.  She is married with 3 children.  She does get out of the house sometimes during the day.  Occasional crying spells.  Social history, surgical history and family history reviewed.  No current outpatient medications on file prior to visit.   No current facility-administered medications on file prior to visit.     No Known Allergies  Social History   Socioeconomic History  . Marital status: Married    Spouse name: Not on file  . Number of children: Not on file  . Years of education: Not on file  . Highest education level: Not on file  Occupational History  . Occupation: retired Animal nutritionist  . Financial resource strain: Not on file  . Food insecurity:    Worry: Not on file    Inability: Not on file  . Transportation needs:    Medical: Not on file    Non-medical: Not on file  Tobacco Use  . Smoking status: Never Smoker  . Smokeless tobacco: Never Used  Substance and Sexual Activity  . Alcohol use: No  .  Drug use: Not on file  . Sexual activity: Not on file  Lifestyle  . Physical activity:    Days per week: Not on file    Minutes per session: Not on file  . Stress: Not on file  Relationships  . Social connections:    Talks on phone: Not on file    Gets together: Not on file    Attends religious service: Not on file    Active member of club or organization: Not on file    Attends meetings of clubs or organizations: Not on file    Relationship status: Not on file  . Intimate partner violence:    Fear of current or ex partner: Not on file    Emotionally abused: Not on file    Physically abused: Not on file    Forced sexual activity: Not on file  Other Topics Concern  . Not on file  Social History Narrative  . Not on file    Family History  Problem Relation Age of Onset  . Stroke Mother     Past Surgical History:  Procedure Laterality Date  . CESAREAN SECTION      ROS: Review of Systems  Constitutional: Negative for activity change.  Eyes: Negative for visual disturbance.  Respiratory: Negative for chest tightness and shortness of breath.  Cardiovascular: Negative for chest pain.  Psychiatric/Behavioral: Positive for dysphoric mood.    PHYSICAL EXAM: BP (!) 176/97   Pulse (!) 56   Temp 98.2 F (36.8 C) (Oral)   Resp 16   Ht 5' 1.5" (1.562 m)   Wt 223 lb 9.6 oz (101.4 kg)   SpO2 97%   BMI 41.56 kg/m   Physical Exam  General appearance - alert, well appearing, middle-aged African female and in no distress Mental status - normal mood, behavior, speech, dress, motor activity, and thought processes Eyes - pupils equal and reactive, extraocular eye movements intact Nose - normal and patent, no erythema, discharge or polyps Mouth - mucous membranes moist, pharynx normal without lesions Neck - supple, no significant adenopathy Chest - clear to auscultation, no wheezes, rales or rhonchi, symmetric air entry Heart - normal rate, regular rhythm, normal S1, S2, no  murmurs, rubs, clicks or gallops Neurological - cranial nerves II through XII intact, normal muscle tone, no tremors, strength 5/5 Extremities - peripheral pulses normal, no pedal edema, no clubbing or cyanosis  Depression screen PHQ 2/9 05/15/2018  Decreased Interest 0  Down, Depressed, Hopeless 0  PHQ - 2 Score 0    ASSESSMENT AND PLAN: 1. Essential hypertension Stressed the importance of good blood pressure control given her history of CVA.  Refill given on all medications. - Comprehensive metabolic panel - CBC - Lipid panel - cloNIDine (CATAPRES) 0.1 MG tablet; Take 1 tablet (0.1 mg total) by mouth 2 (two) times daily.  Dispense: 60 tablet; Refill: 6 - amLODipine (NORVASC) 10 MG tablet; Take 1 tablet (10 mg total) by mouth daily.  Dispense: 30 tablet; Refill: 6 - isosorbide dinitrate (ISORDIL) 20 MG tablet; Take 1 tablet (20 mg total) by mouth 3 (three) times daily.  Dispense: 90 tablet; Refill: 6 - labetalol (NORMODYNE) 200 MG tablet; Take 1 tablet (200 mg total) by mouth 2 (two) times daily.  Dispense: 60 tablet; Refill: 6  2. History of cerebrovascular accident (CVA) Stressed good blood pressure control. Recommend trying aspirin again low-dose while using Plavix instead.  Patient declined both.  Check lipid profile today.  3. Need for hepatitis C screening test Patient agreeable to hepatitis C screening. - Hepatitis C Antibody  4. Screening for HIV (human immunodeficiency virus) Patient agreeable to HIV screening. - HIV antibody  5. Reactive depression Patient reports that she is doing okay and that the depressed mood is intermittent.  She declines being put on any medication for it.  Patient advised to sign release for me to get her records from previous PCP.  Patient was given the opportunity to ask questions.  Patient verbalized understanding of the plan and was able to repeat key elements of the plan.   Orders Placed This Encounter  Procedures  . Comprehensive  metabolic panel  . CBC  . Lipid panel  . HIV antibody  . Hepatitis C Antibody     Requested Prescriptions   Signed Prescriptions Disp Refills  . cloNIDine (CATAPRES) 0.1 MG tablet 60 tablet 6    Sig: Take 1 tablet (0.1 mg total) by mouth 2 (two) times daily.  Marland Kitchen amLODipine (NORVASC) 10 MG tablet 30 tablet 6    Sig: Take 1 tablet (10 mg total) by mouth daily.  . isosorbide dinitrate (ISORDIL) 20 MG tablet 90 tablet 6    Sig: Take 1 tablet (20 mg total) by mouth 3 (three) times daily.  Marland Kitchen labetalol (NORMODYNE) 200 MG tablet 60 tablet 6    Sig:  Take 1 tablet (200 mg total) by mouth 2 (two) times daily.    Return in about 10 weeks (around 07/24/2018).  Karle Plumber, MD, FACP

## 2018-05-16 LAB — COMPREHENSIVE METABOLIC PANEL
ALBUMIN: 4.4 g/dL (ref 3.6–4.8)
ALT: 14 IU/L (ref 0–32)
AST: 15 IU/L (ref 0–40)
Albumin/Globulin Ratio: 1.8 (ref 1.2–2.2)
Alkaline Phosphatase: 64 IU/L (ref 39–117)
BUN / CREAT RATIO: 16 (ref 12–28)
BUN: 40 mg/dL — AB (ref 8–27)
Bilirubin Total: 0.3 mg/dL (ref 0.0–1.2)
CALCIUM: 9.9 mg/dL (ref 8.7–10.3)
CO2: 21 mmol/L (ref 20–29)
CREATININE: 2.47 mg/dL — AB (ref 0.57–1.00)
Chloride: 105 mmol/L (ref 96–106)
GFR calc Af Amer: 24 mL/min/{1.73_m2} — ABNORMAL LOW (ref >59)
GFR, EST NON AFRICAN AMERICAN: 21 mL/min/{1.73_m2} — AB (ref >59)
GLUCOSE: 109 mg/dL — AB (ref 65–99)
Globulin, Total: 2.5 g/dL (ref 1.5–4.5)
Potassium: 4.3 mmol/L (ref 3.5–5.2)
Sodium: 139 mmol/L (ref 134–144)
TOTAL PROTEIN: 6.9 g/dL (ref 6.0–8.5)

## 2018-05-16 LAB — CBC
HEMATOCRIT: 35 % (ref 34.0–46.6)
HEMOGLOBIN: 11.5 g/dL (ref 11.1–15.9)
MCH: 29.6 pg (ref 26.6–33.0)
MCHC: 32.9 g/dL (ref 31.5–35.7)
MCV: 90 fL (ref 79–97)
Platelets: 198 10*3/uL (ref 150–450)
RBC: 3.88 x10E6/uL (ref 3.77–5.28)
RDW: 14.1 % (ref 12.3–15.4)
WBC: 4.7 10*3/uL (ref 3.4–10.8)

## 2018-05-16 LAB — HEPATITIS C ANTIBODY: Hep C Virus Ab: <0.1 s/co ratio (ref 0.0–0.9)

## 2018-05-16 LAB — LIPID PANEL
CHOL/HDL RATIO: 2.6 ratio (ref 0.0–4.4)
Cholesterol, Total: 224 mg/dL — ABNORMAL HIGH (ref 100–199)
HDL: 86 mg/dL (ref >39)
LDL Calculated: 125 mg/dL — ABNORMAL HIGH (ref 0–99)
Triglycerides: 63 mg/dL (ref 0–149)
VLDL Cholesterol Cal: 13 mg/dL (ref 5–40)

## 2018-05-16 LAB — HIV ANTIBODY (ROUTINE TESTING W REFLEX): HIV SCREEN 4TH GENERATION: NONREACTIVE

## 2018-05-17 ENCOUNTER — Other Ambulatory Visit: Payer: Self-pay | Admitting: Internal Medicine

## 2018-05-17 MED ORDER — ATORVASTATIN CALCIUM 40 MG PO TABS: 40.0000 mg | ORAL_TABLET | Freq: Every day | ORAL | 3 refills | Status: DC

## 2018-05-18 ENCOUNTER — Telehealth: Payer: Self-pay

## 2018-05-18 DIAGNOSIS — N184 Chronic kidney disease, stage 4 (severe): Secondary | ICD-10-CM

## 2018-05-18 NOTE — Telephone Encounter (Signed)
Contacted pt to go over lab results pt is aware of results and doesn't have any questions or concerns   Dr. Wynetta Emery pt states no she didn't know about the kidney disease and no she has not seen a nephrologist

## 2018-05-18 NOTE — Addendum Note (Signed)
Addended by: Karle Plumber B on: 05/18/2018 09:04 PM   Modules accepted: Orders

## 2018-06-16 MED FILL — ?CLONIDINE HCL 0.1 MG TABL: 0.1 | 30 days supply | Qty: 60 | Fill #1

## 2018-06-16 MED FILL — ISOSORBIDE DN 20 MG TABLET: 20 | 30 days supply | Qty: 90 | Fill #0

## 2018-06-16 MED FILL — AMLODIPINE BESYLATE 10 MG T: 10 | 30 days supply | Qty: 30 | Fill #1

## 2018-06-16 MED FILL — LABETALOL HCL 200 MG TABLET: 200 | 30 days supply | Qty: 60 | Fill #0

## 2018-06-16 MED FILL — ?ATORVASTATIN 40MG TABLET: 40 | 30 days supply | Qty: 30 | Fill #0

## 2018-07-16 ENCOUNTER — Encounter: Payer: Self-pay | Admitting: Internal Medicine

## 2018-07-16 NOTE — Progress Notes (Signed)
I received notes from [facility] patient has a history of chronic renal failure with creatinine of 1.92, BUN of 30 and estimated GFR of 33 and back in February 2012.

## 2018-07-24 ENCOUNTER — Ambulatory Visit: Payer: Self-pay | Admitting: Internal Medicine

## 2018-07-27 MED FILL — LABETALOL HCL 200 MG TABLET: 200 | 30 days supply | Qty: 60 | Fill #1

## 2018-08-05 MED FILL — ?CLONIDINE HCL 0.1 MG TABL: 0.1 | 30 days supply | Qty: 60 | Fill #2

## 2018-08-05 MED FILL — ?ATORVASTATIN 40MG TABLET: 40 | 30 days supply | Qty: 30 | Fill #1

## 2018-08-05 MED FILL — AMLODIPINE BESYLATE 10 MG T: 10 | 30 days supply | Qty: 30 | Fill #2

## 2018-08-06 MED FILL — FUROSEMIDE 40 MG TAB: 40 | 30 days supply | Qty: 30 | Fill #0

## 2018-08-13 ENCOUNTER — Encounter: Payer: Self-pay | Admitting: Internal Medicine

## 2018-08-13 ENCOUNTER — Ambulatory Visit: Payer: Self-pay | Attending: Internal Medicine | Admitting: Internal Medicine

## 2018-08-13 VITALS — BP 190/99 | HR 68 | Temp 98.6°F | Resp 16 | Wt 228.4 lb

## 2018-08-13 DIAGNOSIS — Z823 Family history of stroke: Secondary | ICD-10-CM | POA: Insufficient documentation

## 2018-08-13 DIAGNOSIS — L97921 Non-pressure chronic ulcer of unspecified part of left lower leg limited to breakdown of skin: Secondary | ICD-10-CM

## 2018-08-13 DIAGNOSIS — I10 Essential (primary) hypertension: Secondary | ICD-10-CM

## 2018-08-13 DIAGNOSIS — N183 Chronic kidney disease, stage 3 unspecified: Secondary | ICD-10-CM

## 2018-08-13 DIAGNOSIS — I129 Hypertensive chronic kidney disease with stage 1 through stage 4 chronic kidney disease, or unspecified chronic kidney disease: Secondary | ICD-10-CM | POA: Insufficient documentation

## 2018-08-13 DIAGNOSIS — Z8673 Personal history of transient ischemic attack (TIA), and cerebral infarction without residual deficits: Secondary | ICD-10-CM | POA: Insufficient documentation

## 2018-08-13 DIAGNOSIS — Z79899 Other long term (current) drug therapy: Secondary | ICD-10-CM | POA: Insufficient documentation

## 2018-08-13 MED ORDER — HYDRALAZINE HCL 10 MG PO TABS: 10.0000 mg | ORAL_TABLET | Freq: Three times a day (TID) | ORAL | 4 refills | Status: DC

## 2018-08-13 MED ORDER — MUPIROCIN 2 % EX OINT: 1.0000 "application " | TOPICAL_OINTMENT | Freq: Two times a day (BID) | CUTANEOUS | 0 refills | Status: DC

## 2018-08-13 MED FILL — ?HydrALAZINE HCL 10 MG TABS: 10 | 30 days supply | Qty: 90 | Fill #0

## 2018-08-13 MED FILL — ISOSORBIDE DN 20 MG TABLET: 20 | 30 days supply | Qty: 90 | Fill #1

## 2018-08-13 MED FILL — MUPIROCIN 2% OINTMENT: 2 | 11 days supply | Qty: 22 | Fill #0

## 2018-08-13 NOTE — Progress Notes (Signed)
Patient ID: [name], female    DOB: November 12, 1955  MRN: [id number]  CC: Hypertension   Subjective: [name] is a 62 y.o. female who presents for chronic ds management.  Spouse is with her. Her concerns today include:  Patient with history of HTN, CVA 2009, CKD  HTN/CKD: On last visit with me, patient was found to have CKD stage IV with creatinine of 2.47 and EGFR of 24.  She was referred to nephrology at [facility] where she was seen last month.  Repeat chemistry revealed creatinine of 1.69 and EGFR of 37.  PTH was 214. Urine protein 977. She was assessed to have CKD likely due to long-standing poorly controlled HTN.  Plan was to have her increase furosemide from 40 mg daily to twice daily.  She is currently still on once a day dosing with plans to increase it to twice a day in 1 week if she tolerates it. Limits salt in the foods.  Reports compliance with medications. Endorses some lower extremity edema. Denies any chest pains or shortness of breath.  Complains of having leg ulcer LLE x 2 mths.  No known injury to the leg.  She has been keeping it covered with a Band-Aid.  Patient Active Problem List   Diagnosis Date Noted  . Essential hypertension 05/15/2018  . Reactive depression 05/15/2018  . History of CVA (cerebrovascular accident) without residual deficits 05/15/2018     Current Outpatient Medications on File Prior to Visit  Medication Sig Dispense Refill  . amLODipine (NORVASC) 10 MG tablet Take 1 tablet (10 mg total) by mouth daily. 30 tablet 6  . atorvastatin (LIPITOR) 40 MG tablet Take 1 tablet (40 mg total) by mouth daily. 90 tablet 3  . cloNIDine (CATAPRES) 0.1 MG tablet Take 1 tablet (0.1 mg total) by mouth 2 (two) times daily. 60 tablet 6  . furosemide (LASIX) 40 MG tablet Take 40 mg by mouth.    . isosorbide dinitrate (ISORDIL) 20 MG tablet Take 1 tablet (20 mg total) by mouth 3 (three) times daily. 90 tablet 6  . labetalol (NORMODYNE) 200 MG tablet Take  1 tablet (200 mg total) by mouth 2 (two) times daily. 60 tablet 6   No current facility-administered medications on file prior to visit.     No Known Allergies  Social History   Socioeconomic History  . Marital status: Married    Spouse name: Not on file  . Number of children: Not on file  . Years of education: Not on file  . Highest education level: Not on file  Occupational History  . Occupation: retired Animal nutritionist  . Financial resource strain: Not on file  . Food insecurity:    Worry: Not on file    Inability: Not on file  . Transportation needs:    Medical: Not on file    Non-medical: Not on file  Tobacco Use  . Smoking status: Never Smoker  . Smokeless tobacco: Never Used  Substance and Sexual Activity  . Alcohol use: No  . Drug use: Not on file  . Sexual activity: Not on file  Lifestyle  . Physical activity:    Days per week: Not on file    Minutes per session: Not on file  . Stress: Not on file  Relationships  . Social connections:    Talks on phone: Not on file    Gets together: Not on file    Attends religious service: Not on file  Active member of club or organization: Not on file    Attends meetings of clubs or organizations: Not on file    Relationship status: Not on file  . Intimate partner violence:    Fear of current or ex partner: Not on file    Emotionally abused: Not on file    Physically abused: Not on file    Forced sexual activity: Not on file  Other Topics Concern  . Not on file  Social History Narrative  . Not on file    Family History  Problem Relation Age of Onset  . Stroke Mother     Past Surgical History:  Procedure Laterality Date  . CESAREAN SECTION      ROS: Review of Systems Negative except as above. PHYSICAL EXAM: BP (!) 190/99   Pulse 68   Temp 98.6 F (37 C) (Oral)   Resp 16   Wt 228 lb 6.4 oz (103.6 kg)   SpO2 97%   BMI 42.46 kg/m   Physical Exam  General appearance - alert, well appearing, and  in no distress Mental status - normal mood, behavior, speech, dress, motor activity, and thought processes Chest - clear to auscultation, no wheezes, rales or rhonchi, symmetric air entry Heart - normal rate, regular rhythm, normal S1, S2, no murmurs, rubs, clicks or gallops Extremities - trace LE edema.  DP/PT pulses 3+ BL, legs/feet warm Skin - 2 cm, shallow ulcer with surrounding hyperpigmentation and exudative base.  Nontender.     Results for orders placed or performed in visit on 05/15/18  Comprehensive metabolic panel  Result Value Ref Range   Glucose 109 (H) 65 - 99 mg/dL   BUN 40 (H) 8 - 27 mg/dL   Creatinine, Ser 2.47 (H) 0.57 - 1.00 mg/dL   GFR calc non Af Amer 21 (L) >59 mL/min/1.73   GFR calc Af Amer 24 (L) >59 mL/min/1.73   BUN/Creatinine Ratio 16 12 - 28   Sodium 139 134 - 144 mmol/L   Potassium 4.3 3.5 - 5.2 mmol/L   Chloride 105 96 - 106 mmol/L   CO2 21 20 - 29 mmol/L   Calcium 9.9 8.7 - 10.3 mg/dL   Total Protein 6.9 6.0 - 8.5 g/dL   Albumin 4.4 3.6 - 4.8 g/dL   Globulin, Total 2.5 1.5 - 4.5 g/dL   Albumin/Globulin Ratio 1.8 1.2 - 2.2   Bilirubin Total 0.3 0.0 - 1.2 mg/dL   Alkaline Phosphatase 64 39 - 117 IU/L   AST 15 0 - 40 IU/L   ALT 14 0 - 32 IU/L  CBC  Result Value Ref Range   WBC 4.7 3.4 - 10.8 x10E3/uL   RBC 3.88 3.77 - 5.28 x10E6/uL   Hemoglobin 11.5 11.1 - 15.9 g/dL   Hematocrit 35.0 34.0 - 46.6 %   MCV 90 79 - 97 fL   MCH 29.6 26.6 - 33.0 pg   MCHC 32.9 31.5 - 35.7 g/dL   RDW 14.1 12.3 - 15.4 %   Platelets 198 150 - 450 x10E3/uL  Lipid panel  Result Value Ref Range   Cholesterol, Total 224 (H) 100 - 199 mg/dL   Triglycerides 63 0 - 149 mg/dL   HDL 86 >39 mg/dL   VLDL Cholesterol Cal 13 5 - 40 mg/dL   LDL Calculated 125 (H) 0 - 99 mg/dL   Chol/HDL Ratio 2.6 0.0 - 4.4 ratio  HIV antibody  Result Value Ref Range   HIV Screen 4th Generation wRfx Non Reactive Non  Reactive  Hepatitis C Antibody  Result Value Ref Range   Hep C Virus Ab <0.1  0.0 - 0.9 s/co ratio    ASSESSMENT AND PLAN: 1. Essential hypertension Not at goal.  She is on a rather complicated regimen of blood pressure medicines.  At this time I think I will add hydralazine and have her return in 2 weeks to see our clinical pharmacist.  I think at some point we are to try switching the labetalol to carvedilol. - hydrALAZINE (APRESOLINE) 10 MG tablet; Take 1 tablet (10 mg total) by mouth 3 (three) times daily.  Dispense: 90 tablet; Refill: 4  2. CKD (chronic kidney disease) stage 3, GFR 30-59 ml/min (HCC) Continue to monitor.  She has been plugged in with nephrology.  3. Ulcer of left lower extremity, limited to breakdown of skin ([place]) Looks almost like a stasis ulcer.  Advised to avoid covering it with Band-Aid.  Given Bactroban ointment to apply twice a day and some 2 x 2 gauze to cover. - mupirocin ointment (BACTROBAN) 2 %; Place 1 application into the nose 2 (two) times daily.  Dispense: 22 g; Refill: 0  Patient was given the opportunity to ask questions.  Patient verbalized understanding of the plan and was able to repeat key elements of the plan.   No orders of the defined types were placed in this encounter.    Requested Prescriptions   Signed Prescriptions Disp Refills  . hydrALAZINE (APRESOLINE) 10 MG tablet 90 tablet 4    Sig: Take 1 tablet (10 mg total) by mouth 3 (three) times daily.  . mupirocin ointment (BACTROBAN) 2 % 22 g 0    Sig: Place 1 application into the nose 2 (two) times daily.    Return in about 6 weeks (around 09/24/2018).  Karle Plumber, MD, FACP

## 2018-08-13 NOTE — Patient Instructions (Addendum)
Give appointment with Lurena Joiner in 2 weeks for repeat blood pressure check.   Start hydralazine 10 mg 3 times daily.  Please get a blood pressure device and check your blood pressure at least 2-3 times a week.  The goal is 130/80 or lower.  Please follow-up with our clinical pharmacist in 2 weeks for repeat blood pressure check.  Use the antibiotic ointment on your leg ulcer once a day.

## 2018-08-13 NOTE — Progress Notes (Signed)
Pt husband stated pt was seen at [facility] a week ago and had her kidney function checked

## 2018-08-14 DIAGNOSIS — N183 Chronic kidney disease, stage 3 unspecified: Secondary | ICD-10-CM | POA: Insufficient documentation

## 2018-08-14 DIAGNOSIS — L97921 Non-pressure chronic ulcer of unspecified part of left lower leg limited to breakdown of skin: Secondary | ICD-10-CM | POA: Insufficient documentation

## 2018-08-28 MED FILL — FUROSEMIDE 40 MG TAB: 40 | 30 days supply | Qty: 30 | Fill #1

## 2018-09-07 MED FILL — LABETALOL HCL 200 MG TABLET: 200 | 30 days supply | Qty: 60 | Fill #2

## 2018-09-14 MED FILL — ?CLONIDINE HCL 0.1 MG TABL: 0.1 | 30 days supply | Qty: 60 | Fill #3

## 2018-09-21 MED FILL — ?HydrALAZINE HCL 10 MG TABS: 10 | 30 days supply | Qty: 90 | Fill #1

## 2018-09-21 MED FILL — ISOSORBIDE DN 20 MG TABLET: 20 | 30 days supply | Qty: 90 | Fill #2

## 2018-09-23 MED FILL — FUROSEMIDE 40 MG TAB: 40 | 30 days supply | Qty: 30 | Fill #2

## 2018-09-23 MED FILL — AMLODIPINE BESYLATE 10 MG T: 10 | 30 days supply | Qty: 30 | Fill #3

## 2018-09-23 MED FILL — ?ATORVASTATIN 40MG TABLET: 40 | 30 days supply | Qty: 30 | Fill #2

## 2018-09-25 ENCOUNTER — Ambulatory Visit: Payer: Self-pay | Admitting: Internal Medicine

## 2018-10-20 MED FILL — LABETALOL HCL 200 MG TABLET: 200 | 30 days supply | Qty: 60 | Fill #3

## 2018-10-20 MED FILL — ?CLONIDINE HCL 0.1 MG TABL: 0.1 | 30 days supply | Qty: 60 | Fill #4

## 2018-10-20 MED FILL — ?FUROSEMIDE 40 MG TABLET: 40 | 30 days supply | Qty: 30 | Fill #3

## 2018-11-05 ENCOUNTER — Encounter: Payer: Self-pay | Admitting: Internal Medicine

## 2018-11-05 ENCOUNTER — Ambulatory Visit: Payer: Self-pay | Attending: Internal Medicine | Admitting: Internal Medicine

## 2018-11-05 VITALS — BP 195/107 | HR 64 | Temp 98.5°F | Resp 16 | Wt 218.6 lb

## 2018-11-05 DIAGNOSIS — I10 Essential (primary) hypertension: Secondary | ICD-10-CM

## 2018-11-05 DIAGNOSIS — N183 Chronic kidney disease, stage 3 unspecified: Secondary | ICD-10-CM

## 2018-11-05 DIAGNOSIS — Z79899 Other long term (current) drug therapy: Secondary | ICD-10-CM | POA: Insufficient documentation

## 2018-11-05 DIAGNOSIS — L97921 Non-pressure chronic ulcer of unspecified part of left lower leg limited to breakdown of skin: Secondary | ICD-10-CM | POA: Insufficient documentation

## 2018-11-05 DIAGNOSIS — Z1211 Encounter for screening for malignant neoplasm of colon: Secondary | ICD-10-CM | POA: Insufficient documentation

## 2018-11-05 DIAGNOSIS — Z1239 Encounter for other screening for malignant neoplasm of breast: Secondary | ICD-10-CM

## 2018-11-05 DIAGNOSIS — I129 Hypertensive chronic kidney disease with stage 1 through stage 4 chronic kidney disease, or unspecified chronic kidney disease: Secondary | ICD-10-CM | POA: Insufficient documentation

## 2018-11-05 DIAGNOSIS — Z23 Encounter for immunization: Secondary | ICD-10-CM | POA: Insufficient documentation

## 2018-11-05 MED ORDER — FUROSEMIDE 40 MG PO TABS: 40.0000 mg | ORAL_TABLET | Freq: Two times a day (BID) | ORAL | 4 refills | Status: DC

## 2018-11-05 MED FILL — hydrALAZINE HCL 10 MG TABS: 10 | 30 days supply | Qty: 90 | Fill #2

## 2018-11-05 MED FILL — FUROSEMIDE 40 MG TAB: 40 | 30 days supply | Qty: 60 | Fill #0

## 2018-11-05 NOTE — Patient Instructions (Addendum)
Please call the BCCCP (breast and cervical cancer control program) at [phone] to schedule diagnostic mammogram   Please take your blood pressure medications as soon as you return home.   I have referred you to the dermatologist for the ulcer on your leg.  Continue dressing changes twice a day.  Td Vaccine (Tetanus and Diphtheria): What You Need to Know 1. Why get vaccinated? Tetanus  and diphtheria are very serious diseases. They are rare in the [place] today, but people who do become infected often have severe complications. Td vaccine is used to protect adolescents and adults from both of these diseases. Both tetanus and diphtheria are infections caused by bacteria. Diphtheria spreads from person to person through coughing or sneezing. Tetanus-causing bacteria enter the body through cuts, scratches, or wounds. TETANUS (Lockjaw) causes painful muscle tightening and stiffness, usually all over the body.  It can lead to tightening of muscles in the head and neck so you can't open your mouth, swallow, or sometimes even breathe. Tetanus kills about 1 out of every 10 people who are infected even after receiving the best medical care. DIPHTHERIA can cause a thick coating to form in the back of the throat.  It can lead to breathing problems, paralysis, heart failure, and death. Before vaccines, as many as 200,000 cases of diphtheria and hundreds of cases of tetanus were reported in the [place] each year. Since vaccination began, reports of cases for both diseases have dropped by about 99%. 2. Td vaccine Td vaccine can protect adolescents and adults from tetanus and diphtheria. Td is usually given as a booster dose every 10 years but it can also be given earlier after a severe and dirty wound or burn. Another vaccine, called Tdap, which protects against pertussis in addition to tetanus and diphtheria, is sometimes recommended instead of Td vaccine. Your doctor or the person giving you  the vaccine can give you more information. Td may safely be given at the same time as other vaccines. 3. Some people should not get this vaccine  A person who has ever had a life-threatening allergic reaction after a previous dose of any tetanus or diphtheria containing vaccine, OR has a severe allergy to any part of this vaccine, should not get Td vaccine. Tell the person giving the vaccine about any severe allergies.  Talk to your doctor if you: ? had severe pain or swelling after any vaccine containing diphtheria or tetanus, ? ever had a condition called Guillain Barr Syndrome (GBS), ? aren't feeling well on the day the shot is scheduled. 4. Risks of a vaccine reaction With any medicine, including vaccines, there is a chance of side effects. These are usually mild and go away on their own. Serious reactions are also possible but are rare. Most people who get Td vaccine do not have any problems with it. Mild Problems following Td vaccine: (Did not interfere with activities)  Pain where the shot was given (about 8 people in 10)  Redness or swelling where the shot was given (about 1 person in 4)  Mild fever (rare)  Headache (about 1 person in 4)  Tiredness (about 1 person in 4) Moderate Problems following Td vaccine: (Interfered with activities, but did not require medical attention)  Fever over 102F (rare) Severe Problems following Td vaccine: (Unable to perform usual activities; required medical attention)  Swelling, severe pain, bleeding and/or redness in the arm where the shot was given (rare). Problems that could happen after any vaccine:  People  sometimes faint after a medical procedure, including vaccination. Sitting or lying down for about 15 minutes can help prevent fainting, and injuries caused by a fall. Tell your doctor if you feel dizzy, or have vision changes or ringing in the ears.  Some people get severe pain in the shoulder and have difficulty moving the arm  where a shot was given. This happens very rarely.  Any medication can cause a severe allergic reaction. Such reactions from a vaccine are very rare, estimated at fewer than 1 in a million doses, and would happen within a few minutes to a few hours after the vaccination. As with any medicine, there is a very remote chance of a vaccine causing a serious injury or death. The safety of vaccines is always being monitored. For more information, visit: http://www.aguilar.org/ 5. What if there is a serious reaction? What should I look for?  Look for anything that concerns you, such as signs of a severe allergic reaction, very high fever, or unusual behavior. Signs of a severe allergic reaction can include hives, swelling of the face and throat, difficulty breathing, a fast heartbeat, dizziness, and weakness. These would usually start a few minutes to a few hours after the vaccination. What should I do?  If you think it is a severe allergic reaction or other emergency that can't wait, call 9-1-1 or get the person to the nearest hospital. Otherwise, call your doctor.  Afterward, the reaction should be reported to the Vaccine Adverse Event Reporting System (VAERS). Your doctor might file this report, or you can do it yourself through the VAERS web site at www.vaers.SamedayNews.es, or by calling 317-551-7985. VAERS does not give medical advice. 6. The National Vaccine Injury Compensation Program The [employer] Vaccine Injury Compensation Program (VICP) is a federal program that was created to compensate people who may have been injured by certain vaccines. Persons who believe they may have been injured by a vaccine can learn about the program and about filing a claim by calling 425-326-3245 or visiting the [place] website at GoldCloset.com.ee. There is a time limit to file a claim for compensation. 7. How can I learn more?  Ask your doctor. He or she can give you the vaccine package insert or  suggest other sources of information.  Call your local or state health department.  Contact the Centers for Disease Control and Prevention (CDC): ? Call 7261568701 (1-800-CDC-INFO) ? Visit CDC's website at http://hunter.com/ Vaccine Information Statement Td Vaccine (01/16/16) This information is not intended to replace advice given to you by your health care provider. Make sure you discuss any questions you have with your health care provider. Document Released: 07/21/2006 Document Revised: 05/11/2018 Document Reviewed: 05/11/2018 Elsevier Interactive Patient Education  2019 [employer].

## 2018-11-05 NOTE — Progress Notes (Signed)
Patient ID: [name], female    DOB: [birth year]  MRN: [id number]  CC: Hypertension   Subjective: [name] is a 63 y.o. female who presents for chronic ds management.  Last seen 08/2018 Her concerns today include:  Patient with history of HTN, CVA 2009, CKD  HYPERTENSION Currently taking: see medication list Med Adherence: [x]  Yes but forgot to take meds this morning.  Furosemide inc to BID by nephrologist but pt states she continues to take once a day because I needed to send rxn to pharmacy reflecting increase dose    Medication side effects: []  Yes    [x]  No Adherence with salt restriction: [x]  Yes    []  No Home Monitoring?: [x]  Yes, several times a wk.  Did not bring log    []  No Home BP results range: gives range of 140-150s/80s SOB? []  Yes    [x]  No Chest Pain?: []  Yes    [x]  No Leg swelling?: []  Yes    [x]  No Headaches?: []  Yes    [x]  No Dizziness? []  Yes    [x]  No Comments:   CKD: She has a follow-up appointment with nephrology at [facility] next month.  LT leg ulcer: On last visit, I prescribed Bactroban ointment and gave some gauze for her to do dressing changes.  Patient reports that she had to stop using the ointment because it was irritating her skin too much in particular causing it to sting.   So she started using Neosporin and the ulcer is now healing  Patient Active Problem List   Diagnosis Date Noted  . CKD (chronic kidney disease) stage 3, GFR 30-59 ml/min (HCC) 08/14/2018  . Ulcer of left lower extremity, limited to breakdown of skin ([place]) 08/14/2018  . Essential hypertension 05/15/2018  . Reactive depression 05/15/2018  . History of CVA (cerebrovascular accident) without residual deficits 05/15/2018     Current Outpatient Medications on File Prior to Visit  Medication Sig Dispense Refill  . amLODipine (NORVASC) 10 MG tablet Take 1 tablet (10 mg total) by mouth daily. 30 tablet 6  . atorvastatin (LIPITOR) 40 MG tablet Take 1 tablet (40 mg  total) by mouth daily. 90 tablet 3  . cloNIDine (CATAPRES) 0.1 MG tablet Take 1 tablet (0.1 mg total) by mouth 2 (two) times daily. 60 tablet 6  . hydrALAZINE (APRESOLINE) 10 MG tablet Take 1 tablet (10 mg total) by mouth 3 (three) times daily. 90 tablet 4  . isosorbide dinitrate (ISORDIL) 20 MG tablet Take 1 tablet (20 mg total) by mouth 3 (three) times daily. 90 tablet 6  . labetalol (NORMODYNE) 200 MG tablet Take 1 tablet (200 mg total) by mouth 2 (two) times daily. 60 tablet 6  . mupirocin ointment (BACTROBAN) 2 % Place 1 application into the nose 2 (two) times daily. (Patient not taking: Reported on 11/05/2018) 22 g 0   No current facility-administered medications on file prior to visit.     No Known Allergies  Social History   Socioeconomic History  . Marital status: Married    Spouse name: Not on file  . Number of children: Not on file  . Years of education: Not on file  . Highest education level: Not on file  Occupational History  . Occupation: retired Animal nutritionist  . Financial resource strain: Not on file  . Food insecurity:    Worry: Not on file    Inability: Not on file  . Transportation needs:  Medical: Not on file    Non-medical: Not on file  Tobacco Use  . Smoking status: Never Smoker  . Smokeless tobacco: Never Used  Substance and Sexual Activity  . Alcohol use: No  . Drug use: Not on file  . Sexual activity: Not on file  Lifestyle  . Physical activity:    Days per week: Not on file    Minutes per session: Not on file  . Stress: Not on file  Relationships  . Social connections:    Talks on phone: Not on file    Gets together: Not on file    Attends religious service: Not on file    Active member of club or organization: Not on file    Attends meetings of clubs or organizations: Not on file    Relationship status: Not on file  . Intimate partner violence:    Fear of current or ex partner: Not on file    Emotionally abused: Not on file     Physically abused: Not on file    Forced sexual activity: Not on file  Other Topics Concern  . Not on file  Social History Narrative  . Not on file    Family History  Problem Relation Age of Onset  . Stroke Mother     Past Surgical History:  Procedure Laterality Date  . CESAREAN SECTION      ROS: Review of Systems Neg except as above  PHYSICAL EXAM: BP (!) 195/107 (BP Location: Left Arm, Patient Position: Sitting, Cuff Size: Large)   Pulse 64   Temp 98.5 F (36.9 C) (Oral)   Resp 16   Wt 218 lb 9.6 oz (99.2 kg)   SpO2 96%   BMI 40.64 kg/m   Wt Readings from Last 3 Encounters:  11/05/18 218 lb 9.6 oz (99.2 kg)  08/13/18 228 lb 6.4 oz (103.6 kg)  05/15/18 223 lb 9.6 oz (101.4 kg)   Physical Exam  General appearance - alert, well appearing, and in no distress Mental status - normal mood, behavior, speech, dress, motor activity, and thought processes Neck - supple, no significant adenopathy Chest - clear to auscultation, no wheezes, rales or rhonchi, symmetric air entry Heart - normal rate, regular rhythm, normal S1, S2, no murmurs, rubs, clicks or gallops Extremities - peripheral pulses normal, no pedal edema, no clubbing or cyanosis Skin -also on the left lower extremity inner aspect appears slightly improved from when I saw her 2 months ago.  Also is about 1 cm in size with hyperpigmented borders.  Slight yellowish drainage noted on 2 x 2 gauze that was removed.  No erythema.    ASSESSMENT AND PLAN: 1. Essential hypertension Not at goal.  Patient has not taken any of her medicines as yet for today.  I encourage compliance with medications to try to preserve what is left of her kidney function - furosemide (LASIX) 40 MG tablet; Take 1 tablet (40 mg total) by mouth 2 (two) times daily.  Dispense: 60 tablet; Refill: 4  2. CKD (chronic kidney disease) stage 3, GFR 30-59 ml/min (HCC) Keep appointment with nephrologist. - furosemide (LASIX) 40 MG tablet; Take 1 tablet  (40 mg total) by mouth 2 (two) times daily.  Dispense: 60 tablet; Refill: 4  3. Ulcer of left lower extremity, limited to breakdown of skin ([place]) This has been slow to heal.  I will refer her to dermatology.  In the meantime I recommend continue dressing changes twice a day by cleaning it with  plain water and then applying 2 x 2 gauze to dry surface - Ambulatory referral to Dermatology  4. Need for Tdap vaccination   5. Colon cancer screening Discussed colon cancer screening methods.  Patient not wanting to do colonoscopy but is agreeable to fit test - Fecal occult blood, imunochemical([facility])  6. Breast cancer screening - MM Digital Screening; Future     Patient was given the opportunity to ask questions.  Patient verbalized understanding of the plan and was able to repeat key elements of the plan.   Orders Placed This Encounter  Procedures  . Fecal occult blood, imunochemical([facility])  . MM Digital Screening  . Ambulatory referral to Dermatology     Requested Prescriptions   Signed Prescriptions Disp Refills  . furosemide (LASIX) 40 MG tablet 60 tablet 4    Sig: Take 1 tablet (40 mg total) by mouth 2 (two) times daily.    Return in about 1 month (around 12/05/2018) for PAP.  Karle Plumber, MD, FACP

## 2018-11-12 MED FILL — ?ATORVASTATIN 40MG TABLET: 40 | 30 days supply | Qty: 30 | Fill #3

## 2018-11-12 MED FILL — ISOSORBIDE DN 20 MG TABLET: 20 | 30 days supply | Qty: 90 | Fill #3

## 2018-11-15 LAB — FECAL OCCULT BLOOD, IMMUNOCHEMICAL: Fecal Occult Bld: NEGATIVE

## 2018-11-16 ENCOUNTER — Telehealth: Payer: Self-pay

## 2018-11-16 NOTE — Telephone Encounter (Signed)
Contacted pt to go over Fit Test pt didn't answer and was unable to lvm due to call can not be completed at this time

## 2018-11-16 NOTE — Telephone Encounter (Signed)
-----   Message from Ladell Pier, MD sent at 11/15/2018  9:39 AM EST ----- FIT test is negative.  Will repeat again in one year.

## 2018-11-25 MED FILL — AMLODIPINE BESYLATE 10 MG T: 10 | 30 days supply | Qty: 30 | Fill #4

## 2018-11-25 MED FILL — LABETALOL HCL 200 MG TABLET: 200 | 30 days supply | Qty: 60 | Fill #4

## 2018-12-14 ENCOUNTER — Other Ambulatory Visit: Payer: Self-pay | Admitting: Internal Medicine

## 2018-12-14 MED FILL — LABETALOL HCL 200 MG TABLET: 200 | 30 days supply | Qty: 90 | Fill #0

## 2018-12-17 MED FILL — hydrALAZINE HCL 10 MG TABS: 10 | 30 days supply | Qty: 90 | Fill #3

## 2018-12-17 MED FILL — ISOSORBIDE DN 20 MG TABLET: 20 | 30 days supply | Qty: 90 | Fill #4

## 2018-12-23 MED FILL — ?ATORVASTATIN 40MG TABLET: 40 | 30 days supply | Qty: 30 | Fill #4

## 2019-01-18 MED FILL — ?ATORVASTATIN 40MG TABLET: 40 | 90 days supply | Qty: 90 | Fill #5

## 2019-01-18 MED FILL — ?AMLODIPINE BESYLATE 10 MG: 10 | 60 days supply | Qty: 60 | Fill #5

## 2019-01-18 MED FILL — ?FUROSEMIDE 40 MG TABLET: 40 | 90 days supply | Qty: 180 | Fill #1

## 2019-01-18 MED FILL — ISOSORBIDE DN 20 MG TABLET: 20 | 30 days supply | Qty: 90 | Fill #5

## 2019-01-18 MED FILL — hydrALAZINE HCL 10 MG TABS: 10 | 30 days supply | Qty: 90 | Fill #4

## 2019-01-18 MED FILL — LABETALOL HCL 200 MG TABLET: 200 | 30 days supply | Qty: 90 | Fill #1

## 2019-03-03 ENCOUNTER — Other Ambulatory Visit: Payer: Self-pay | Admitting: Internal Medicine

## 2019-03-03 DIAGNOSIS — I10 Essential (primary) hypertension: Secondary | ICD-10-CM

## 2019-03-03 MED FILL — ?FUROSEMIDE 40 MG TABLET: 40 | 30 days supply | Qty: 60 | Fill #2

## 2019-03-03 MED FILL — ISOSORBIDE DN 20 MG TABLET: 20 | 30 days supply | Qty: 90 | Fill #6

## 2019-03-03 MED FILL — LABETALOL HCL 200 MG TABLET: 200 | 30 days supply | Qty: 90 | Fill #2

## 2019-03-04 ENCOUNTER — Other Ambulatory Visit: Payer: Self-pay | Admitting: Internal Medicine

## 2019-03-04 DIAGNOSIS — I10 Essential (primary) hypertension: Secondary | ICD-10-CM

## 2019-03-04 MED FILL — hydrALAZINE HCL 10 MG TABS: 10 | 30 days supply | Qty: 90 | Fill #0

## 2019-04-20 ENCOUNTER — Other Ambulatory Visit: Payer: Self-pay | Admitting: Internal Medicine

## 2019-04-20 DIAGNOSIS — I10 Essential (primary) hypertension: Secondary | ICD-10-CM

## 2019-04-20 MED FILL — LABETALOL HCL 200 MG TABLET: 200 | 30 days supply | Qty: 90 | Fill #3

## 2019-04-20 MED FILL — ?AMLODIPINE BESYLATE 10 MG: 10 | 30 days supply | Qty: 30 | Fill #0

## 2019-04-21 ENCOUNTER — Other Ambulatory Visit: Payer: Self-pay

## 2019-04-21 ENCOUNTER — Telehealth: Payer: Self-pay | Admitting: Internal Medicine

## 2019-04-21 ENCOUNTER — Ambulatory Visit: Payer: Self-pay | Admitting: Physician Assistant

## 2019-04-21 NOTE — Telephone Encounter (Signed)
1) Medication(s) Requested (by name): LABETALOL HCL 200 take three times daily  2) Pharmacy of Choice: [facility]   3) Special Requests: No refills remain. No more doses left.   Approved medications will be sent to the pharmacy, we will reach out if there is an issue.  Requests made after 3pm may not be addressed until the following business day!  If a patient is unsure of the name of the medication(s) please note and ask patient to call back when they are able to provide all info, do not send to responsible party until all information is available!

## 2019-04-21 NOTE — Progress Notes (Signed)
This encounter was created in error - please disregard.

## 2019-04-21 NOTE — Telephone Encounter (Signed)
Pt has not been seen since 11/05/18 by Dr. Wynetta Emery.  Rx last written on 05/15/18 60 quantity with 6 refills. Pt had an appointment today with the walk-in provider and no showed Pt is scheduled to see walk-in provider tomorrow on 7/16

## 2019-04-22 ENCOUNTER — Ambulatory Visit: Payer: Self-pay | Attending: Family Medicine | Admitting: Physician Assistant

## 2019-04-22 ENCOUNTER — Other Ambulatory Visit: Payer: Self-pay

## 2019-04-22 DIAGNOSIS — I10 Essential (primary) hypertension: Secondary | ICD-10-CM

## 2019-04-22 DIAGNOSIS — N183 Chronic kidney disease, stage 3 unspecified: Secondary | ICD-10-CM

## 2019-04-22 MED ORDER — FUROSEMIDE 40 MG PO TABS: 40.0000 mg | ORAL_TABLET | Freq: Two times a day (BID) | ORAL | 4 refills | Status: DC

## 2019-04-22 MED ORDER — ISOSORBIDE DINITRATE 20 MG PO TABS: 20.0000 mg | ORAL_TABLET | Freq: Three times a day (TID) | ORAL | 3 refills | Status: DC

## 2019-04-22 MED ORDER — LABETALOL HCL 200 MG PO TABS: 200.0000 mg | ORAL_TABLET | Freq: Two times a day (BID) | ORAL | 3 refills | Status: DC

## 2019-04-22 MED ORDER — HYDRALAZINE HCL 10 MG PO TABS: 10.0000 mg | ORAL_TABLET | Freq: Three times a day (TID) | ORAL | 3 refills | Status: DC

## 2019-04-22 MED ORDER — AMLODIPINE BESYLATE 10 MG PO TABS: 10.0000 mg | ORAL_TABLET | Freq: Every day | ORAL | 3 refills | Status: DC

## 2019-04-22 MED FILL — FUROSEMIDE 40 MG TAB: 40 | 30 days supply | Qty: 60 | Fill #0

## 2019-04-22 MED FILL — ?HydrALAZINE HCL 10 MG TABS: 10 | 30 days supply | Qty: 90 | Fill #0

## 2019-04-22 MED FILL — ISOSORBIDE DN 20 MG TABLET: 20 | 30 days supply | Qty: 90 | Fill #0

## 2019-04-22 NOTE — Progress Notes (Signed)
Virtual Visit via Telephone Note  I connected with Bascom Levels on 04/22/19 at  8:30 AM EDT by telephone and verified that I am speaking with the correct person using two identifiers.   I discussed the limitations, risks, security and privacy concerns of performing an evaluation and management service by telephone and the availability of in person appointments. I also discussed with the patient that there may be a patient responsible charge related to this service. The patient expressed understanding and agreed to proceed.  Patient location:  Home  My Location:  [place] office Persons on the call:  Me and the patient.    History of Present Illness:  Patient needing RF.  Denies CP/SOB/dizziness.  No longer on Clonidine.  Denies fever.  No concerns or complaints.    Observations/Objective:  A&Ox3   Assessment and Plan: 1. Essential hypertension Check BP 2-3 times/week leading up to next appt and bring to clinic with you - labetalol (NORMODYNE) 200 MG tablet; Take 1 tablet (200 mg total) by mouth 2 (two) times daily.  Dispense: 60 tablet; Refill: 3 - furosemide (LASIX) 40 MG tablet; Take 1 tablet (40 mg total) by mouth 2 (two) times daily.  Dispense: 60 tablet; Refill: 4 - hydrALAZINE (APRESOLINE) 10 MG tablet; Take 1 tablet (10 mg total) by mouth 3 (three) times daily.  Dispense: 90 tablet; Refill: 3 - isosorbide dinitrate (ISORDIL) 20 MG tablet; Take 1 tablet (20 mg total) by mouth 3 (three) times daily.  Dispense: 90 tablet; Refill: 3 - amLODipine (NORVASC) 10 MG tablet; Take 1 tablet (10 mg total) by mouth daily.  Dispense: 30 tablet; Refill: 3  2. CKD (chronic kidney disease) stage 3, GFR 30-59 ml/min (HCC) - furosemide (LASIX) 40 MG tablet; Take 1 tablet (40 mg total) by mouth 2 (two) times daily.  Dispense: 60 tablet; Refill: 4    Follow Up Instructions: 2 months with PCP; will be due for bloodwork   I discussed the assessment and treatment plan with the patient. The patient  was provided an opportunity to ask questions and all were answered. The patient agreed with the plan and demonstrated an understanding of the instructions.   The patient was advised to call back or seek an in-person evaluation if the symptoms worsen or if the condition fails to improve as anticipated.  I provided 8 minutes of non-face-to-face time during this encounter.   Freeman Caldron, PA-C  Patient ID: Bascom Levels, female   DOB: 02-19-1956, 63 y.o.   MRN: [id number]

## 2019-04-22 NOTE — Progress Notes (Signed)
Patient verified DOB Patient has taken medication today. Patient has not eaten today. Patient denies pain at this time.

## 2019-05-31 ENCOUNTER — Other Ambulatory Visit: Payer: Self-pay | Admitting: Internal Medicine

## 2019-05-31 MED FILL — AMLODIPINE BESYLATE 10 MG T: 10 | 30 days supply | Qty: 30 | Fill #0

## 2019-05-31 MED FILL — hydrALAZINE HCL 10 MG TABS: 10 | 30 days supply | Qty: 90 | Fill #1

## 2019-05-31 MED FILL — ISOSORBIDE DN 20 MG TABLET: 20 | 30 days supply | Qty: 90 | Fill #1

## 2019-05-31 MED FILL — LABETALOL HCL 200 MG TABLET: 200 | 30 days supply | Qty: 60 | Fill #0

## 2019-06-01 MED FILL — ATORVASTATIN 40 MG TABLET: 40 | 30 days supply | Qty: 30 | Fill #0

## 2019-06-07 ENCOUNTER — Telehealth: Payer: Self-pay | Admitting: Internal Medicine

## 2019-06-07 NOTE — Telephone Encounter (Signed)
New Message   Pt states she seen Dr. Margarita Rana and her medication labetalol (NORMODYNE) 200 MG tablet was switched to 2x a day instead of 3x and the pt is requesting it to go back to the 3x. Please f/u

## 2019-06-07 NOTE — Telephone Encounter (Signed)
Will forward to Angela

## 2019-06-07 NOTE — Telephone Encounter (Signed)
Patient's medication was changed by Levada Dy. Patient is requestiong medication to go back to 3 times a day.

## 2019-06-08 ENCOUNTER — Other Ambulatory Visit: Payer: Self-pay | Admitting: Physician Assistant

## 2019-06-08 DIAGNOSIS — I10 Essential (primary) hypertension: Secondary | ICD-10-CM

## 2019-06-08 MED ORDER — LABETALOL HCL 200 MG PO TABS: 200.0000 mg | ORAL_TABLET | Freq: Three times a day (TID) | ORAL | 3 refills | Status: DC

## 2019-06-08 NOTE — Telephone Encounter (Signed)
I reviewed the chart and it does appear that she was on this 3 times daily before.  I have sent a new prescription to our pharmacy for 3 times daily.  Thanks, Freeman Caldron, PA-C

## 2019-06-25 MED FILL — LABETALOL HCL 200 MG TABLET: 200 | 30 days supply | Qty: 60 | Fill #1

## 2019-07-08 ENCOUNTER — Ambulatory Visit: Payer: Self-pay | Admitting: Internal Medicine

## 2019-07-22 MED FILL — ISOSORBIDE DN 20 MG TABLET: 20 | 30 days supply | Qty: 90 | Fill #2

## 2019-07-22 MED FILL — ATORVASTATIN CALCIUM 40 MG: 40 | 30 days supply | Qty: 30 | Fill #1

## 2019-07-22 MED FILL — LABETALOL HCL 200 MG TABLET: 200 | 30 days supply | Qty: 60 | Fill #2

## 2019-07-22 MED FILL — hydrALAZINE HCL 10 MG TABS: 10 | 30 days supply | Qty: 90 | Fill #2

## 2019-07-22 MED FILL — FUROSEMIDE 40 MG TAB: 40 | 30 days supply | Qty: 60 | Fill #1

## 2019-07-22 MED FILL — AMLODIPINE BESYLATE 10 MG T: 10 | 30 days supply | Qty: 30 | Fill #1

## 2019-09-17 MED FILL — LABETALOL HCL 200 MG TABLET: 200 | 30 days supply | Qty: 60 | Fill #3

## 2019-09-17 MED FILL — ATORVASTATIN CALCIUM 40 MG: 40 | 30 days supply | Qty: 30 | Fill #2

## 2019-09-17 MED FILL — AMLODIPINE BESYLATE 10 MG T: 10 | 30 days supply | Qty: 30 | Fill #2

## 2019-09-17 MED FILL — FUROSEMIDE 40 MG TAB: 40 | 30 days supply | Qty: 60 | Fill #2

## 2019-09-17 MED FILL — ISOSORBIDE DN 20 MG TABLET: 20 | 30 days supply | Qty: 90 | Fill #3

## 2019-09-17 MED FILL — hydrALAZINE HCL 10 MG TABS: 10 | 30 days supply | Qty: 90 | Fill #3

## 2019-10-29 ENCOUNTER — Telehealth: Payer: Self-pay | Admitting: Internal Medicine

## 2019-10-29 DIAGNOSIS — N183 Chronic kidney disease, stage 3 unspecified: Secondary | ICD-10-CM

## 2019-10-29 DIAGNOSIS — I10 Essential (primary) hypertension: Secondary | ICD-10-CM

## 2019-10-29 MED FILL — AMLODIPINE BESYLATE 10 MG T: 10 | 30 days supply | Qty: 30 | Fill #3

## 2019-10-29 MED FILL — FUROSEMIDE 40 MG TAB: 40 | 30 days supply | Qty: 60 | Fill #3

## 2019-10-29 NOTE — Telephone Encounter (Signed)
Please refill if appropriate

## 2019-10-29 NOTE — Telephone Encounter (Signed)
Patient called and requested for listed medications to be refilled and sent to [facility] pharmacy at your earliest convenience.   Amlodipine Atorvastatin Furosemide Hydralazine Isosorbide Labetalol

## 2019-10-29 NOTE — Telephone Encounter (Signed)
She has not been seen since 04/22/19. No planned follow-ups. Approval needs to come from Dr. Wynetta Emery.

## 2019-10-31 MED ORDER — AMLODIPINE BESYLATE 10 MG PO TABS: 10.0000 mg | ORAL_TABLET | Freq: Every day | ORAL | 1 refills | Status: DC

## 2019-10-31 MED ORDER — ISOSORBIDE DINITRATE 20 MG PO TABS: 20.0000 mg | ORAL_TABLET | Freq: Three times a day (TID) | ORAL | 1 refills | Status: DC

## 2019-10-31 MED ORDER — FUROSEMIDE 40 MG PO TABS: 40.0000 mg | ORAL_TABLET | Freq: Two times a day (BID) | ORAL | 1 refills | Status: DC

## 2019-10-31 MED ORDER — LABETALOL HCL 200 MG PO TABS: 200.0000 mg | ORAL_TABLET | Freq: Three times a day (TID) | ORAL | 1 refills | Status: DC

## 2019-10-31 MED ORDER — ATORVASTATIN CALCIUM 40 MG PO TABS: 40.0000 mg | ORAL_TABLET | Freq: Every day | ORAL | 1 refills | Status: DC

## 2019-10-31 MED ORDER — HYDRALAZINE HCL 10 MG PO TABS: 10.0000 mg | ORAL_TABLET | Freq: Three times a day (TID) | ORAL | 1 refills | Status: DC

## 2019-11-01 MED FILL — hydrALAZINE HCL 10 MG TABS: 10 | 30 days supply | Qty: 90 | Fill #0

## 2019-11-01 MED FILL — LABETALOL HCL 200 MG TABLET: 200 | 30 days supply | Qty: 90 | Fill #0

## 2019-11-01 MED FILL — ?ATORVASTATIN 40MG TABL: 40 | 30 days supply | Qty: 30 | Fill #0

## 2019-11-01 MED FILL — ISOSORBIDE DN 20 MG TABLET: 20 | 30 days supply | Qty: 90 | Fill #0

## 2019-11-01 NOTE — Telephone Encounter (Signed)
Contacted pt to schedule follow up with pcp pt didn't answer lvm asking pt to give [place] a call to schedule appointment

## 2019-11-16 ENCOUNTER — Ambulatory Visit: Payer: Self-pay | Attending: Internal Medicine | Admitting: Internal Medicine

## 2019-11-16 ENCOUNTER — Other Ambulatory Visit: Payer: Self-pay

## 2019-11-16 ENCOUNTER — Other Ambulatory Visit: Payer: Self-pay | Admitting: Internal Medicine

## 2019-11-16 DIAGNOSIS — Z1211 Encounter for screening for malignant neoplasm of colon: Secondary | ICD-10-CM

## 2019-11-16 DIAGNOSIS — N184 Chronic kidney disease, stage 4 (severe): Secondary | ICD-10-CM

## 2019-11-16 DIAGNOSIS — Z8673 Personal history of transient ischemic attack (TIA), and cerebral infarction without residual deficits: Secondary | ICD-10-CM

## 2019-11-16 DIAGNOSIS — I10 Essential (primary) hypertension: Secondary | ICD-10-CM

## 2019-11-16 MED ORDER — ISOSORBIDE DINITRATE 20 MG PO TABS: 20.0000 mg | ORAL_TABLET | Freq: Three times a day (TID) | ORAL | 4 refills | Status: DC

## 2019-11-16 MED ORDER — FUROSEMIDE 40 MG PO TABS: 40.0000 mg | ORAL_TABLET | Freq: Two times a day (BID) | ORAL | 4 refills | Status: DC

## 2019-11-16 MED ORDER — ATORVASTATIN CALCIUM 40 MG PO TABS: 40.0000 mg | ORAL_TABLET | Freq: Every day | ORAL | 4 refills | Status: DC

## 2019-11-16 MED ORDER — LABETALOL HCL 200 MG PO TABS: 200.0000 mg | ORAL_TABLET | Freq: Three times a day (TID) | ORAL | 4 refills | Status: DC

## 2019-11-16 MED ORDER — AMLODIPINE BESYLATE 10 MG PO TABS: 10.0000 mg | ORAL_TABLET | Freq: Every day | ORAL | 4 refills | Status: DC

## 2019-11-16 MED ORDER — HYDRALAZINE HCL 25 MG PO TABS: 25.0000 mg | ORAL_TABLET | Freq: Three times a day (TID) | ORAL | 4 refills | Status: DC

## 2019-11-16 NOTE — Progress Notes (Signed)
Virtual Visit via Telephone Note Due to current restrictions/limitations of in-office visits due to the COVID-19 pandemic, this scheduled clinical appointment was converted to a telehealth visit  I connected with [name] on 11/16/19 at 11:52 a.m by telephone and verified that I am speaking with the correct person using two identifiers. I am in my office.  The patient is at home.  Only the patient and myself participated in this encounter.  I discussed the limitations, risks, security and privacy concerns of performing an evaluation and management service by telephone and the availability of in person appointments. I also discussed with the patient that there may be a patient responsible charge related to this service. The patient expressed understanding and agreed to proceed.   History of Present Illness: Patient with history of HTN, CVA 2009, CKD 4.  Patient last evaluated by me 10/2018.  Purpose of today's visit is chronic disease management.   CKD 4: saw nephrologist at [facility] 11/2018.  At that time labetalol was increased to 200 mg 3 times a day.  She does not have a follow-up visit.  She still makes good urine.  HYPERTENSION Currently taking: see medication list Med Adherence: [x]  Yes - but wants to decrease Furosemide to 40 mg once a day instead of BID because it causes her to urinate too much Medication side effects: []  Yes    [x]  No Adherence with salt restriction: [x]  Yes    []  No Home Monitoring?: [x]  Yes     Monitoring Frequency: once a day Home BP results range:  151/85, 145/87, 142/80 SOB? []  Yes    [x]  No Chest Pain?: []  Yes    [x]  No Leg swelling?: []  Yes    [x]  No Headaches?: []  Yes    [x]  No Dizziness? []  Yes    [x]  No Comments:   Hx of CVA:  Tolerating and taking Lipitor.  Not on ASA because it caused bleeding with clots in throat.  Decline ASA and Plavix in past. Outpatient Encounter Medications as of 11/16/2019  Medication Sig  . amLODipine (NORVASC) 10  MG tablet Take 1 tablet (10 mg total) by mouth daily.  Marland Kitchen atorvastatin (LIPITOR) 40 MG tablet Take 1 tablet (40 mg total) by mouth daily.  . furosemide (LASIX) 40 MG tablet Take 1 tablet (40 mg total) by mouth 2 (two) times daily.  . hydrALAZINE (APRESOLINE) 10 MG tablet Take 1 tablet (10 mg total) by mouth 3 (three) times daily.  . isosorbide dinitrate (ISORDIL) 20 MG tablet Take 1 tablet (20 mg total) by mouth 3 (three) times daily.  Marland Kitchen labetalol (NORMODYNE) 200 MG tablet Take 1 tablet (200 mg total) by mouth 3 (three) times daily.   No facility-administered encounter medications on file as of 11/16/2019.    Observations/Objective: Results for orders placed or performed in visit on 11/05/18  Fecal occult blood, imunochemical([facility])   Specimen: Stool   STOOL  Result Value Ref Range   Fecal Occult Bld Negative Negative   Lab Results  Component Value Date   WBC 4.7 05/15/2018   HGB 11.5 05/15/2018   HCT 35.0 05/15/2018   MCV 90 05/15/2018   PLT 198 05/15/2018     Chemistry      Component Value Date/Time   NA 139 05/15/2018 1004   K 4.3 05/15/2018 1004   CL 105 05/15/2018 1004   CO2 21 05/15/2018 1004   BUN 40 (H) 05/15/2018 1004   CREATININE 2.47 (H) 05/15/2018 1004  Component Value Date/Time   CALCIUM 9.9 05/15/2018 1004   CALCIUM 9.8 08/22/2008 2256   ALKPHOS 64 05/15/2018 1004   AST 15 05/15/2018 1004   ALT 14 05/15/2018 1004   BILITOT 0.3 05/15/2018 1004        Assessment and Plan: 1. Essential hypertension Not at goal. Increase hydralazine to 25 mg 3 times a day. Continue other medications including amlodipine, furosemide, isosorbide, labetalol.  She will come to the lab tomorrow to have blood test done - amLODipine (NORVASC) 10 MG tablet; Take 1 tablet (10 mg total) by mouth daily.  Dispense: 30 tablet; Refill: 4 - furosemide (LASIX) 40 MG tablet; Take 1 tablet (40 mg total) by mouth 2 (two) times daily.  Dispense: 60 tablet; Refill: 4 -  hydrALAZINE (APRESOLINE) 25 MG tablet; Take 1 tablet (25 mg total) by mouth 3 (three) times daily.  Dispense: 90 tablet; Refill: 4 - isosorbide dinitrate (ISORDIL) 20 MG tablet; Take 1 tablet (20 mg total) by mouth 3 (three) times daily.  Dispense: 90 tablet; Refill: 4 - labetalol (NORMODYNE) 200 MG tablet; Take 1 tablet (200 mg total) by mouth 3 (three) times daily.  Dispense: 90 tablet; Refill: 4 - CBC; Future - Comprehensive metabolic panel; Future - Lipid panel; Future  2. CKD (chronic kidney disease) stage 3, GFR 30-59 ml/min Due for blood test for recheck on kidney function.  Advised to avoid NSAIDs. - furosemide (LASIX) 40 MG tablet; Take 1 tablet (40 mg total) by mouth 2 (two) times daily.  Dispense: 60 tablet; Refill: 4  3. History of CVA (cerebrovascular accident) without residual deficits Continue atorvastatin.  Ideally she should also be on aspirin or Plavix but patient declines due to previous history of what sounds like severe episode of epistaxis in the past with bleeding into the throat  4. Colon cancer screening - Fecal occult blood, imunochemical([facility]); Future   Follow Up Instructions: 6 weeks for Pap smear   I discussed the assessment and treatment plan with the patient. The patient was provided an opportunity to ask questions and all were answered. The patient agreed with the plan and demonstrated an understanding of the instructions.   The patient was advised to call back or seek an in-person evaluation if the symptoms worsen or if the condition fails to improve as anticipated.  I provided 14 minutes of non-face-to-face time during this encounter.   Karle Plumber, MD

## 2019-11-17 ENCOUNTER — Other Ambulatory Visit: Payer: Self-pay

## 2019-11-17 ENCOUNTER — Ambulatory Visit: Payer: Self-pay | Attending: Internal Medicine

## 2019-11-17 DIAGNOSIS — I10 Essential (primary) hypertension: Secondary | ICD-10-CM

## 2019-11-18 ENCOUNTER — Other Ambulatory Visit: Payer: Self-pay | Admitting: Internal Medicine

## 2019-11-18 DIAGNOSIS — N184 Chronic kidney disease, stage 4 (severe): Secondary | ICD-10-CM

## 2019-11-18 LAB — COMPREHENSIVE METABOLIC PANEL
ALT: 13 IU/L (ref 0–32)
AST: 17 IU/L (ref 0–40)
Albumin/Globulin Ratio: 1.4 (ref 1.2–2.2)
Albumin: 4.2 g/dL (ref 3.8–4.8)
Alkaline Phosphatase: 71 IU/L (ref 39–117)
BUN/Creatinine Ratio: 12 (ref 12–28)
BUN: 25 mg/dL (ref 8–27)
Bilirubin Total: 0.5 mg/dL (ref 0.0–1.2)
CO2: 23 mmol/L (ref 20–29)
Calcium: 9.5 mg/dL (ref 8.7–10.3)
Chloride: 106 mmol/L (ref 96–106)
Creatinine, Ser: 2.08 mg/dL — ABNORMAL HIGH (ref 0.57–1.00)
GFR calc Af Amer: 29 mL/min/{1.73_m2} — ABNORMAL LOW (ref >59)
GFR calc non Af Amer: 25 mL/min/{1.73_m2} — ABNORMAL LOW (ref >59)
Globulin, Total: 2.9 g/dL (ref 1.5–4.5)
Glucose: 110 mg/dL — ABNORMAL HIGH (ref 65–99)
Potassium: 4.3 mmol/L (ref 3.5–5.2)
Sodium: 144 mmol/L (ref 134–144)
Total Protein: 7.1 g/dL (ref 6.0–8.5)

## 2019-11-18 LAB — CBC
Hematocrit: 38.2 % (ref 34.0–46.6)
Hemoglobin: 12.5 g/dL (ref 11.1–15.9)
MCH: 29.1 pg (ref 26.6–33.0)
MCHC: 32.7 g/dL (ref 31.5–35.7)
MCV: 89 fL (ref 79–97)
Platelets: 183 10*3/uL (ref 150–450)
RBC: 4.3 x10E6/uL (ref 3.77–5.28)
RDW: 13.2 % (ref 11.7–15.4)
WBC: 3.8 10*3/uL (ref 3.4–10.8)

## 2019-11-18 LAB — LIPID PANEL
Chol/HDL Ratio: 2 ratio (ref 0.0–4.4)
Cholesterol, Total: 195 mg/dL (ref 100–199)
HDL: 100 mg/dL (ref >39)
LDL Chol Calc (NIH): 85 mg/dL (ref 0–99)
Triglycerides: 52 mg/dL (ref 0–149)
VLDL Cholesterol Cal: 10 mg/dL (ref 5–40)

## 2019-11-23 ENCOUNTER — Telehealth: Payer: Self-pay

## 2019-11-23 NOTE — Telephone Encounter (Signed)
Patient called requesting lab results. Patient was informed, verbalized understanding and had no further questions.

## 2019-11-23 NOTE — Telephone Encounter (Signed)
Contacted pt to go over lab results pt didn't answer lvm asking pt to give a call back at her earliest convenience  

## 2019-12-03 ENCOUNTER — Other Ambulatory Visit: Payer: Self-pay

## 2019-12-03 ENCOUNTER — Ambulatory Visit: Payer: Self-pay | Attending: Internal Medicine

## 2019-12-03 MED FILL — hydrALAZINE HCL 25 MG TABS: 25 | 30 days supply | Qty: 90 | Fill #0

## 2019-12-10 MED FILL — LABETALOL HCL 200 MG TABS: 200 | 30 days supply | Qty: 90 | Fill #1

## 2019-12-10 MED FILL — ISOSORBIDE DINITRATE 20 MG: 20 | 30 days supply | Qty: 90 | Fill #1

## 2019-12-17 ENCOUNTER — Telehealth: Payer: Self-pay | Admitting: Internal Medicine

## 2019-12-17 NOTE — Telephone Encounter (Signed)
Pt was sent a letter from financial dept. Inform them, that the application they submitted was incomplete, since they were missing some documentation at the time of the appointment, Pt need to reschedule and resubmit all new papers and application for CAFA and [place], P.S. old documents has been sent back by mail to the Pt and Pt. need to make a new appt. 

## 2019-12-23 ENCOUNTER — Ambulatory Visit: Payer: Self-pay | Attending: Internal Medicine | Admitting: Internal Medicine

## 2019-12-23 ENCOUNTER — Other Ambulatory Visit: Payer: Self-pay

## 2019-12-23 ENCOUNTER — Encounter: Payer: Self-pay | Admitting: Internal Medicine

## 2019-12-23 VITALS — BP 170/90 | HR 82 | Temp 97.2°F | Resp 16 | Wt 228.2 lb

## 2019-12-23 DIAGNOSIS — Z124 Encounter for screening for malignant neoplasm of cervix: Secondary | ICD-10-CM

## 2019-12-23 DIAGNOSIS — I10 Essential (primary) hypertension: Secondary | ICD-10-CM

## 2019-12-23 DIAGNOSIS — Z1231 Encounter for screening mammogram for malignant neoplasm of breast: Secondary | ICD-10-CM

## 2019-12-23 DIAGNOSIS — Z1211 Encounter for screening for malignant neoplasm of colon: Secondary | ICD-10-CM

## 2019-12-23 MED ORDER — HYDRALAZINE HCL 50 MG PO TABS: 50.0000 mg | ORAL_TABLET | Freq: Three times a day (TID) | ORAL | 5 refills | Status: DC

## 2019-12-23 MED FILL — hydrALAZINE HCL 50 MG TABS: 50 | 30 days supply | Qty: 90 | Fill #0

## 2019-12-23 NOTE — Progress Notes (Signed)
Patient ID: [name], female    DOB: 1956/06/28  MRN: [id number]  CC: Gynecologic Exam   Subjective: [name] is a 64 y.o. female who presents for GYN exam Her concerns today include:  Patient with history of HTN, CVA 2009, CKD 4.  Pt is G4P3 No hx of abn pap.   No vaginal dischg or itching.  Sexually active with spouse only.  Does not want STD screening Pt is menopausal since her 18s No fhx of breast or ovarian cancer.  HTN:  Took meds already for the morning..  Compliant with amlodipine, isosorbide, labetalol, furosemide and hydralazine.  She limits salt in the foods.  She denies any chest pains or shortness of breath.  No lower extremity swelling.  We referred her for follow-up with nephrology at [facility] on last telephone visit.  She tells me that she has an appointment coming up on the 30th of this month.  Recent chemistry revealed that GFR is stable in the range for stage IV CKD.   Patient Active Problem List   Diagnosis Date Noted  . CKD (chronic kidney disease) stage 3, GFR 30-59 ml/min 08/14/2018  . Ulcer of left lower extremity, limited to breakdown of skin ([place]) 08/14/2018  . Essential hypertension 05/15/2018  . Reactive depression 05/15/2018  . History of CVA (cerebrovascular accident) without residual deficits 05/15/2018     Current Outpatient Medications on File Prior to Visit  Medication Sig Dispense Refill  . amLODipine (NORVASC) 10 MG tablet Take 1 tablet (10 mg total) by mouth daily. 30 tablet 4  . atorvastatin (LIPITOR) 40 MG tablet Take 1 tablet (40 mg total) by mouth daily. 30 tablet 4  . furosemide (LASIX) 40 MG tablet Take 1 tablet (40 mg total) by mouth 2 (two) times daily. 60 tablet 4  . hydrALAZINE (APRESOLINE) 25 MG tablet Take 1 tablet (25 mg total) by mouth 3 (three) times daily. 90 tablet 4  . isosorbide dinitrate (ISORDIL) 20 MG tablet Take 1 tablet (20 mg total) by mouth 3 (three) times daily. 90 tablet 4  . labetalol  (NORMODYNE) 200 MG tablet Take 1 tablet (200 mg total) by mouth 3 (three) times daily. 90 tablet 4   No current facility-administered medications on file prior to visit.    No Known Allergies  Social History   Socioeconomic History  . Marital status: Married    Spouse name: Not on file  . Number of children: Not on file  . Years of education: Not on file  . Highest education level: Not on file  Occupational History  . Occupation: retired Therapist, sports  Tobacco Use  . Smoking status: Never Smoker  . Smokeless tobacco: Never Used  Substance and Sexual Activity  . Alcohol use: No  . Drug use: Not on file  . Sexual activity: Not Currently  Other Topics Concern  . Not on file  Social History Narrative  . Not on file   Social Determinants of Health   Financial Resource Strain:   . Difficulty of Paying Living Expenses:   Food Insecurity:   . Worried About Charity fundraiser in the Last Year:   . Arboriculturist in the Last Year:   Transportation Needs:   . Film/video editor (Medical):   Marland Kitchen Lack of Transportation (Non-Medical):   Physical Activity:   . Days of Exercise per Week:   . Minutes of Exercise per Session:   Stress:   . Feeling of Stress :  Social Connections:   . Frequency of Communication with Friends and Family:   . Frequency of Social Gatherings with Friends and Family:   . Attends Religious Services:   . Active Member of Clubs or Organizations:   . Attends Archivist Meetings:   Marland Kitchen Marital Status:   Intimate Partner Violence:   . Fear of Current or Ex-Partner:   . Emotionally Abused:   Marland Kitchen Physically Abused:   . Sexually Abused:     Family History  Problem Relation Age of Onset  . Stroke Mother     Past Surgical History:  Procedure Laterality Date  . CESAREAN SECTION      ROS: Review of Systems Negative except as stated above  PHYSICAL EXAM: BP (!) 179/92   Pulse 82   Temp (!) 97.2 F (36.2 C)   Resp 16   Wt 228 lb 3.2 oz (103.5  kg)   SpO2 95%   BMI 42.42 kg/m   Physical Exam   General appearance - alert, well appearing, and in no distress Mental status - normal mood, behavior, speech, dress, motor activity, and thought processes Chest - clear to auscultation, no wheezes, rales or rhonchi, symmetric air entry Heart - normal rate, regular rhythm, normal S1, S2, no murmurs, rubs, clicks or gallops Breasts - breasts appear normal, no suspicious masses, no skin or nipple changes or axillary nodes Pelvic -CMA Oretha Milch present: Normal external genitalia, vulva, vagina, cervix, uterus and adnexa Extremities - trace LE edema   CMP Latest Ref Rng & Units 11/17/2019 05/15/2018 01/06/2014  Glucose 65 - 99 mg/dL 110(H) 109(H) 113(H)  BUN 8 - 27 mg/dL 25 40(H) 27(H)  Creatinine 0.57 - 1.00 mg/dL 2.08(H) 2.47(H) 1.84(H)  Sodium 134 - 144 mmol/L 144 139 142  Potassium 3.5 - 5.2 mmol/L 4.3 4.3 3.8  Chloride 96 - 106 mmol/L 106 105 105  CO2 20 - 29 mmol/L 23 21 23   Calcium 8.7 - 10.3 mg/dL 9.5 9.9 9.6  Total Protein 6.0 - 8.5 g/dL 7.1 6.9 -  Total Bilirubin 0.0 - 1.2 mg/dL 0.5 0.3 -  Alkaline Phos 39 - 117 IU/L 71 64 -  AST 0 - 40 IU/L 17 15 -  ALT 0 - 32 IU/L 13 14 -   Lipid Panel     Component Value Date/Time   CHOL 195 11/17/2019 1211   TRIG 52 11/17/2019 1211   HDL 100 11/17/2019 1211   CHOLHDL 2.0 11/17/2019 1211   CHOLHDL 2.2 Ratio 11/29/2010 2115   VLDL 13 11/29/2010 2115   LDLCALC 85 11/17/2019 1211   LDLDIRECT 108 (H) 05/07/2010 1841    CBC    Component Value Date/Time   WBC 3.8 11/17/2019 1211   WBC 4.1 01/06/2014 1802   RBC 4.30 11/17/2019 1211   RBC 3.83 (L) 01/06/2014 1802   HGB 12.5 11/17/2019 1211   HCT 38.2 11/17/2019 1211   PLT 183 11/17/2019 1211   MCV 89 11/17/2019 1211   MCH 29.1 11/17/2019 1211   MCH 30.0 01/06/2014 1802   MCHC 32.7 11/17/2019 1211   MCHC 33.6 01/06/2014 1802   RDW 13.2 11/17/2019 1211   LYMPHSABS 1.7 01/06/2014 1802   MONOABS 0.4 01/06/2014 1802   EOSABS  0.2 01/06/2014 1802   BASOSABS 0.0 01/06/2014 1802    ASSESSMENT AND PLAN: 1. Pap smear for cervical cancer screening - Cytology - PAP  2. Encounter for screening mammogram for malignant neoplasm of breast - MM Digital Screening; Future  3. Essential hypertension  Not at goal.  Increase hydralazine to 50 mg 3 times a day - hydrALAZINE (APRESOLINE) 50 MG tablet; Take 1 tablet (50 mg total) by mouth 3 (three) times daily.  Dispense: 90 tablet; Refill: 5  4. Screening for colon cancer - Fecal occult blood, imunochemical([facility])  Patient was given the opportunity to ask questions.  Patient verbalized understanding of the plan and was able to repeat key elements of the plan.   No orders of the defined types were placed in this encounter.    Requested Prescriptions    No prescriptions requested or ordered in this encounter    No follow-ups on file.  Karle Plumber, MD, FACP

## 2019-12-23 NOTE — Patient Instructions (Signed)
Please take the fecal kit at home and then returning to [place].  Your blood pressure is not at goal.  We have increase the hydralazine to 50 mg 3 times a day.

## 2019-12-28 LAB — CYTOLOGY - PAP
Comment: NEGATIVE
High risk HPV: NEGATIVE

## 2019-12-29 ENCOUNTER — Other Ambulatory Visit: Payer: Self-pay

## 2019-12-29 ENCOUNTER — Ambulatory Visit: Payer: Self-pay | Attending: Internal Medicine | Admitting: Pharmacist

## 2019-12-29 ENCOUNTER — Telehealth: Payer: Self-pay

## 2019-12-29 ENCOUNTER — Encounter: Payer: Self-pay | Admitting: Internal Medicine

## 2019-12-29 DIAGNOSIS — R87612 Low grade squamous intraepithelial lesion on cytologic smear of cervix (LGSIL): Secondary | ICD-10-CM | POA: Insufficient documentation

## 2019-12-29 DIAGNOSIS — Z23 Encounter for immunization: Secondary | ICD-10-CM

## 2019-12-29 NOTE — Progress Notes (Signed)
Let pt know that her pap showed some abnormal cells but screen for the cancer causing virus called HPV was negative.  These cells can sometimes become cancerous so we need to keep a close watch on this.  So we need to repeat the pap with testing for the HPV virus in one year.

## 2019-12-29 NOTE — Telephone Encounter (Signed)
Contacted pt to go over pap results pt didn't answer lvm asking pt to give a call back at her earliest convenience

## 2019-12-29 NOTE — Progress Notes (Signed)
Patient presents for vaccination against influenza per orders of Dr. Johnson. Consent given. Counseling provided. No contraindications exists. Vaccine administered without incident.   

## 2020-01-06 ENCOUNTER — Telehealth: Payer: Self-pay | Admitting: Internal Medicine

## 2020-01-06 MED FILL — AMLODIPINE BESYLATE 10 MG T: 10 | 30 days supply | Qty: 30 | Fill #0

## 2020-01-06 MED FILL — ?ATORVASTATIN 40MG TABLET: 40 | 30 days supply | Qty: 30 | Fill #0

## 2020-01-06 NOTE — Telephone Encounter (Signed)
Pt came in for pap results please call pt back.Marland Kitchen

## 2020-01-10 NOTE — Telephone Encounter (Signed)
Returned pt call and went over pap results pt is aware and doesn't have any questions or concerns

## 2020-01-21 MED FILL — ISOSORBIDE DN 20 MG TABLET: 20 | 30 days supply | Qty: 90 | Fill #0

## 2020-01-21 MED FILL — LABETALOL HCL 200 MG TABS: 200 | 30 days supply | Qty: 90 | Fill #0

## 2020-01-21 MED FILL — FUROSEMIDE 40 MG TAB: 40 | 30 days supply | Qty: 60 | Fill #4

## 2020-02-09 MED FILL — AMLODIPINE BESYLATE 10 MG T: 10 | 30 days supply | Qty: 30 | Fill #1

## 2020-02-09 MED FILL — hydrALAZINE HCL 50 MG TABS: 50 | 30 days supply | Qty: 90 | Fill #1

## 2020-02-09 MED FILL — ?ATORVASTATIN 40MG TABLET: 40 | 30 days supply | Qty: 30 | Fill #1

## 2020-02-16 ENCOUNTER — Other Ambulatory Visit: Payer: Self-pay

## 2020-02-16 ENCOUNTER — Ambulatory Visit: Payer: Self-pay | Attending: Internal Medicine

## 2020-03-01 MED FILL — LABETALOL HCL 200 MG TABLET: 200 | 30 days supply | Qty: 90 | Fill #1

## 2020-03-01 MED FILL — ISOSORBIDE DN 20 MG TABLET: 20 | 30 days supply | Qty: 90 | Fill #1

## 2020-03-20 MED FILL — hydrALAZINE HCL 50 MG TABS: 50 | 30 days supply | Qty: 90 | Fill #2

## 2020-03-20 MED FILL — ?AMLODIPINE BESYL 10MG TABL: 10 | 30 days supply | Qty: 30 | Fill #2

## 2020-03-23 ENCOUNTER — Encounter: Payer: Self-pay | Admitting: Internal Medicine

## 2020-03-23 ENCOUNTER — Ambulatory Visit: Payer: Self-pay | Attending: Internal Medicine | Admitting: Internal Medicine

## 2020-03-23 ENCOUNTER — Other Ambulatory Visit: Payer: Self-pay

## 2020-03-23 VITALS — BP 145/85

## 2020-03-23 DIAGNOSIS — R87612 Low grade squamous intraepithelial lesion on cytologic smear of cervix (LGSIL): Secondary | ICD-10-CM

## 2020-03-23 DIAGNOSIS — I10 Essential (primary) hypertension: Secondary | ICD-10-CM

## 2020-03-23 DIAGNOSIS — N184 Chronic kidney disease, stage 4 (severe): Secondary | ICD-10-CM

## 2020-03-23 MED ORDER — HYDRALAZINE HCL 50 MG PO TABS: 75.0000 mg | ORAL_TABLET | Freq: Three times a day (TID) | ORAL | 5 refills | Status: DC

## 2020-03-23 MED FILL — hydrALAZINE HCL 50 MG TABS: 50 | 30 days supply | Qty: 135 | Fill #0

## 2020-03-23 NOTE — Progress Notes (Signed)
Virtual Visit via Telephone Note Due to current restrictions/limitations of in-office visits due to the COVID-19 pandemic, this scheduled clinical appointment was converted to a telehealth visit  I connected with [name] on 03/23/20 at 11:55 a.m EDT by telephone and verified that I am speaking with the correct person using two identifiers. I am in my office.  The patient is at home.  Only the patient and myself participated in this encounter.  I discussed the limitations, risks, security and privacy concerns of performing an evaluation and management service by telephone and the availability of in person appointments. I also discussed with the patient that there may be a patient responsible charge related to this service. The patient expressed understanding and agreed to proceed.   History of Present Illness: Patient with history of HTN, CVA 2009, CKD4.  Last evaluated 12/2019.  Purpose of today's visit is chronic disease management.   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  Plan for repeat in one yr which will be 12/2020.  She mailed FIT but we have not received. MMG ordered 12/2019. Pt states she was called yesterday regarding this.  Told she will be called when they have an opening. Had COVID vaccine series - does not have card handy at this time  HYPERTENSION Currently taking: see medication list Med Adherence: [x]  Yes    []  No Medication side effects: []  Yes    [x]  No Adherence with salt restriction: [x]  Yes    []  No Home Monitoring?: [x]  Yes    []  No Monitoring Frequency:  daily Home BP results range: 145/85, 140/80, 143/89, 145/86 SOB? []  Yes    [x]  No Chest Pain?: []  Yes    [x]  No Leg swelling?: []  Yes    [x]  No Headaches?: []  Yes    [x]  No Dizziness? []  Yes    [x]  No Comments:  Started walking daily in the evenings  CKD 4:  Missed appt yesterday with nephrology.  Will call today or tomorrow to reschedule   Outpatient Encounter Medications as of 03/23/2020   Medication Sig  . amLODipine (NORVASC) 10 MG tablet Take 1 tablet (10 mg total) by mouth daily.  Marland Kitchen atorvastatin (LIPITOR) 40 MG tablet Take 1 tablet (40 mg total) by mouth daily.  . furosemide (LASIX) 40 MG tablet Take 1 tablet (40 mg total) by mouth 2 (two) times daily.  . hydrALAZINE (APRESOLINE) 50 MG tablet Take 1 tablet (50 mg total) by mouth 3 (three) times daily.  . isosorbide dinitrate (ISORDIL) 20 MG tablet Take 1 tablet (20 mg total) by mouth 3 (three) times daily.  Marland Kitchen labetalol (NORMODYNE) 200 MG tablet Take 1 tablet (200 mg total) by mouth 3 (three) times daily.   No facility-administered encounter medications on file as of 03/23/2020.    Observations/Objective: Lab Results  Component Value Date   CHOL 195 11/17/2019   HDL 100 11/17/2019   LDLCALC 85 11/17/2019   LDLDIRECT 108 (H) 05/07/2010   TRIG 52 11/17/2019   CHOLHDL 2.0 11/17/2019     Chemistry      Component Value Date/Time   NA 144 11/17/2019 1211   K 4.3 11/17/2019 1211   CL 106 11/17/2019 1211   CO2 23 11/17/2019 1211   BUN 25 11/17/2019 1211   CREATININE 2.08 (H) 11/17/2019 1211      Component Value Date/Time   CALCIUM 9.5 11/17/2019 1211   CALCIUM 9.8 08/22/2008 2256   ALKPHOS 71 11/17/2019 1211   AST  17 11/17/2019 1211   ALT 13 11/17/2019 1211   BILITOT 0.5 11/17/2019 1211     Lab Results  Component Value Date   WBC 3.8 11/17/2019   HGB 12.5 11/17/2019   HCT 38.2 11/17/2019   MCV 89 11/17/2019   PLT 183 11/17/2019     Assessment and Plan: 1. Essential hypertension Not at goal.  Continue current medications.  Increase hydralazine to 75 mg 3 times a day.  Updated prescription sent to the pharmacy.  Continue to monitor home blood pressure with goal being 130/80 or lower. - hydrALAZINE (APRESOLINE) 50 MG tablet; Take 1.5 tablets (75 mg total) by mouth 3 (three) times daily.  Dispense: 135 tablet; Refill: 5  2. CKD (chronic kidney disease) stage 4, GFR 15-29 ml/min (H[facility]) Stressed the  importance of good blood pressure control. Encouraged her to call the nephrologist office back to reschedule her appointment.  3.  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  HM: I will have the lab check to see if we received the FIT test.  She will call us back with the dates of the COVID-19 vaccine which she received.   Follow Up Instructions: 4 mth   I discussed the assessment and treatment plan with the patient. The patient was provided an opportunity to ask questions and all were answered. The patient agreed with the plan and demonstrated an understanding of the instructions.   The patient was advised to call back or seek an in-person evaluation if the symptoms worsen or if the condition fails to improve as anticipated.  I provided 14 minutes of non-face-to-face time during this encounter.   Karle Plumber, MD

## 2020-03-29 ENCOUNTER — Other Ambulatory Visit: Payer: Self-pay

## 2020-03-29 DIAGNOSIS — Z1231 Encounter for screening mammogram for malignant neoplasm of breast: Secondary | ICD-10-CM

## 2020-04-11 ENCOUNTER — Ambulatory Visit
Admission: RE | Admit: 2020-04-11 | Discharge: 2020-04-11 | Disposition: A | Discharge status: Home / Self Care | Payer: No Typology Code available for payment source | Source: Ambulatory Visit | Attending: Internal Medicine | Admitting: Internal Medicine

## 2020-04-11 ENCOUNTER — Other Ambulatory Visit: Payer: Self-pay

## 2020-04-11 ENCOUNTER — Other Ambulatory Visit: Payer: Self-pay | Admitting: Internal Medicine

## 2020-04-11 DIAGNOSIS — I10 Essential (primary) hypertension: Secondary | ICD-10-CM

## 2020-04-11 DIAGNOSIS — N184 Chronic kidney disease, stage 4 (severe): Secondary | ICD-10-CM

## 2020-04-11 DIAGNOSIS — Z1231 Encounter for screening mammogram for malignant neoplasm of breast: Secondary | ICD-10-CM

## 2020-04-11 MED ORDER — ATORVASTATIN CALCIUM 40 MG PO TABS: 40.0000 mg | ORAL_TABLET | Freq: Every day | ORAL | 4 refills | Status: DC

## 2020-04-11 MED ORDER — FUROSEMIDE 40 MG PO TABS: 40.0000 mg | ORAL_TABLET | Freq: Two times a day (BID) | ORAL | 4 refills | Status: DC

## 2020-04-11 MED ORDER — AMLODIPINE BESYLATE 10 MG PO TABS: 10.0000 mg | ORAL_TABLET | Freq: Every day | ORAL | 4 refills | Status: DC

## 2020-04-11 MED ORDER — HYDRALAZINE HCL 50 MG PO TABS: 75.0000 mg | ORAL_TABLET | Freq: Three times a day (TID) | ORAL | 5 refills | Status: DC

## 2020-04-11 MED ORDER — ISOSORBIDE DINITRATE 20 MG PO TABS: 20.0000 mg | ORAL_TABLET | Freq: Three times a day (TID) | ORAL | 4 refills | Status: DC

## 2020-04-11 MED FILL — FUROSEMIDE 40 MG TAB: 40 | 30 days supply | Qty: 60 | Fill #0

## 2020-04-11 MED FILL — ISOSORBIDE DN 20 MG TABLET: 20 | 30 days supply | Qty: 90 | Fill #0

## 2020-04-11 MED FILL — ?ATORVASTATIN 40MG TABLET: 40 | 30 days supply | Qty: 30 | Fill #0

## 2020-04-11 NOTE — Telephone Encounter (Signed)
Medication Refill - Medication: amLODipine (NORVASC) 10 MG tablet,atorvastatin (LIPITOR) 40 MG tablet ,furosemide (LASIX) 40 MG tablet ,hydrALAZINE (APRESOLINE) 50 MG tablet, isosorbide dinitrate (ISORDIL) 20 MG tablet   , ,   Has the patient contacted their pharmacy? yes (Agent: If no, request that the patient contact the pharmacy for the refill.) (Agent: If yes, when and what did the pharmacy advise?)Contact Pcp  Preferred Pharmacy (with phone number or street name):  [facility] [place] [address] Phone:  [phone]  Fax:  [phone]       Agent: Please be advised that RX refills may take up to 3 business days. We ask that you follow-up with your pharmacy.

## 2020-04-12 MED FILL — LABETALOL HCL 200 MG TABLET: 200 | 30 days supply | Qty: 90 | Fill #2

## 2020-04-14 MED FILL — ?AMLODIPINE BESYL 10MG TABL: 10 | 30 days supply | Qty: 30 | Fill #3

## 2020-05-18 MED FILL — ?ISOSORBIDE DN 20 MG TABLET: 20 | 30 days supply | Qty: 90 | Fill #1

## 2020-05-18 MED FILL — AMLODIPINE BESYLATE 10 MG T: 10 | 30 days supply | Qty: 30 | Fill #4

## 2020-05-18 MED FILL — FUROSEMIDE 40 MG TAB: 40 | 30 days supply | Qty: 60 | Fill #1

## 2020-05-18 MED FILL — LABETALOL HCL 200 MG TABLET: 200 | 30 days supply | Qty: 90 | Fill #3

## 2020-06-20 MED FILL — hydrALAZINE HCL 50 MG TABS: 50 | 30 days supply | Qty: 90 | Fill #3

## 2020-06-22 ENCOUNTER — Telehealth: Payer: Self-pay | Admitting: Internal Medicine

## 2020-06-22 NOTE — Telephone Encounter (Signed)
Tried contacting pt to make aware of medication dosage pt didn't answer and was unable to lvm due to vm not being set up

## 2020-06-22 NOTE — Telephone Encounter (Signed)
Copied from [place] [id number]. Topic: Quick Communication - See Telephone Encounter >> Jun 22, 2020 11:17 AM Loma Boston wrote: CRM for notification. See Telephone encounter for: 06/22/20. CB [phone] hydrALAZINE (APRESOLINE) 50 MG tablet Medication Date: 04/11/2020 Department: [facility] Ordering/Authorizing: Ladell Pier, MD  Pt states dr told her that was increasing this med but now she is confused on dosage as was 64 now 34. Pls fu at [phone]

## 2020-06-23 NOTE — Telephone Encounter (Signed)
Spoke w/ pt and she reported that she rec'd Hydralazine 50mg  #90 but should have rc'd #135 per new instructions/dose increase, spoke w/ pharmacy, old script still on file, new script on hold, pt can take what was rc'd and come back for new script once RF is needed again, pt aware of this and verbalized understanding.

## 2020-06-23 NOTE — Telephone Encounter (Signed)
Please try and reach pt again

## 2020-07-03 MED FILL — LABETALOL HCL 200 MG TABS: 200 | 30 days supply | Qty: 90 | Fill #4

## 2020-07-03 MED FILL — ?ISOSORBIDE DN 20 MG TABLET: 20 | 30 days supply | Qty: 90 | Fill #2

## 2020-07-20 ENCOUNTER — Telehealth: Payer: Self-pay | Admitting: Internal Medicine

## 2020-07-20 MED FILL — AMLODIPINE BESYLATE 10 MG T: 10 | 30 days supply | Qty: 30 | Fill #0

## 2020-07-20 MED FILL — hydrALAZINE HCL 50 MG TABS: 50 | 30 days supply | Qty: 135 | Fill #1

## 2020-07-20 NOTE — Telephone Encounter (Signed)
Patient would like the nurse or doctor to call her regarding her medication.  CB# [phone]

## 2020-07-21 NOTE — Telephone Encounter (Signed)
Spoke w/ pt, she had a question regarding hydralazine dose and quantity at pharmacy, pt reported that she followed up w/ pharmacy and received the correct dose/quantity, no further needs at this time

## 2020-07-25 ENCOUNTER — Ambulatory Visit: Payer: Self-pay | Admitting: Internal Medicine

## 2020-08-14 ENCOUNTER — Other Ambulatory Visit: Payer: Self-pay

## 2020-08-14 ENCOUNTER — Ambulatory Visit: Payer: Self-pay | Attending: Internal Medicine | Admitting: Internal Medicine

## 2020-08-14 ENCOUNTER — Other Ambulatory Visit: Payer: Self-pay | Admitting: Internal Medicine

## 2020-08-14 ENCOUNTER — Encounter: Payer: Self-pay | Admitting: Internal Medicine

## 2020-08-14 VITALS — BP 146/86 | HR 70 | Temp 97.9°F | Resp 16 | Wt 239.0 lb

## 2020-08-14 DIAGNOSIS — I10 Essential (primary) hypertension: Secondary | ICD-10-CM

## 2020-08-14 DIAGNOSIS — N184 Chronic kidney disease, stage 4 (severe): Secondary | ICD-10-CM

## 2020-08-14 DIAGNOSIS — Z6841 Body Mass Index (BMI) 40.0 and over, adult: Secondary | ICD-10-CM

## 2020-08-14 DIAGNOSIS — E782 Mixed hyperlipidemia: Secondary | ICD-10-CM

## 2020-08-14 DIAGNOSIS — Z23 Encounter for immunization: Secondary | ICD-10-CM

## 2020-08-14 DIAGNOSIS — Z1211 Encounter for screening for malignant neoplasm of colon: Secondary | ICD-10-CM

## 2020-08-14 MED ORDER — HYDRALAZINE HCL 100 MG PO TABS: 100.0000 mg | ORAL_TABLET | Freq: Three times a day (TID) | ORAL | 6 refills | Status: DC

## 2020-08-14 MED ORDER — ATORVASTATIN CALCIUM 40 MG PO TABS: 40.0000 mg | ORAL_TABLET | Freq: Every day | ORAL | 3 refills | Status: DC

## 2020-08-14 MED FILL — LABETALOL HCL 200 MG TABS: 200 | 30 days supply | Qty: 90 | Fill #0

## 2020-08-14 MED FILL — ATORVASTATIN CALCIUM 40 MG: 40 | 30 days supply | Qty: 30 | Fill #0

## 2020-08-14 MED FILL — AMLODIPINE BESYLATE 10 MG T: 10 | 30 days supply | Qty: 30 | Fill #1

## 2020-08-14 MED FILL — hydrALAZINE HCL 100 MG TABS: 100 | 30 days supply | Qty: 90 | Fill #0

## 2020-08-14 MED FILL — ?ISOSORBIDE DN 20 MG TABLET: 20 | 30 days supply | Qty: 90 | Fill #3

## 2020-08-14 NOTE — Patient Instructions (Signed)
Increase hydralazine to 100 mg 3 times a day.  Continue to monitor your blood pressure at home at least twice a week.  The goal is 130/80 or lower.  I have resubmitted the referral to try to get you back in with your kidney specialist.  Hold off on refilling the Lipitor until I see what your cholesterol tests results look like.   Obesity, Adult Obesity is having too much body fat. Being obese means that your weight is more than what is healthy for you. BMI is a number that explains how much body fat you have. If you have a BMI of 30 or more, you are obese. Obesity is often caused by eating or drinking more calories than your body uses. Changing your lifestyle can help you lose weight. Obesity can cause serious health problems, such as:  Stroke.  Coronary artery disease (CAD).  Type 2 diabetes.  Some types of cancer, including cancers of the colon, breast, uterus, and gallbladder.  Osteoarthritis.  High blood pressure (hypertension).  High cholesterol.  Sleep apnea.  Gallbladder stones.  Infertility problems. What are the causes?  Eating meals each day that are high in calories, sugar, and fat.  Being born with genes that may make you more likely to become obese.  Having a medical condition that causes obesity.  Taking certain medicines.  Sitting a lot (having a sedentary lifestyle).  Not getting enough sleep.  Drinking a lot of drinks that have sugar in them. What increases the risk?  Having a family history of obesity.  Being an [place] American woman.  Being a Hispanic man.  Living in an area with limited access to: ? Romilda Garret, recreation centers, or sidewalks. ? Healthy food choices, such as grocery stores and farmers' markets. What are the signs or symptoms? The main sign is having too much body fat. How is this treated?  Treatment for this condition often includes changing your lifestyle. Treatment may include: ? Changing your diet. This may include  making a healthy meal plan. ? Exercise. This may include activity that causes your heart to beat faster (aerobic exercise) and strength training. Work with your doctor to design a program that works for you. ? Medicine to help you lose weight. This may be used if you are not able to lose 1 pound a week after 6 weeks of healthy eating and more exercise. ? Treating conditions that cause the obesity. ? Surgery. Options may include gastric banding and gastric bypass. This may be done if:  Other treatments have not helped to improve your condition.  You have a BMI of 40 or higher.  You have life-threatening health problems related to obesity. Follow these instructions at home: Eating and drinking   Follow advice from your doctor about what to eat and drink. Your doctor may tell you to: ? Limit fast food, sweets, and processed snack foods. ? Choose low-fat options. For example, choose low-fat milk instead of whole milk. ? Eat 5 or more servings of fruits or vegetables each day. ? Eat at home more often. This gives you more control over what you eat. ? Choose healthy foods when you eat out. ? Learn to read food labels. This will help you learn how much food is in 1 serving. ? Keep low-fat snacks available. ? Avoid drinks that have a lot of sugar in them. These include soda, fruit juice, iced tea with sugar, and flavored milk.  Drink enough water to keep your pee (urine) pale yellow.  Do not go on fad diets. Physical activity  Exercise often, as told by your doctor. Most adults should get up to 150 minutes of moderate-intensity exercise every week.Ask your doctor: ? What types of exercise are safe for you. ? How often you should exercise.  Warm up and stretch before being active.  Do slow stretching after being active (cool down).  Rest between times of being active. Lifestyle  Work with your doctor and a food expert (dietitian) to set a weight-loss goal that is best for  you.  Limit your screen time.  Find ways to reward yourself that do not involve food.  Do not drink alcohol if: ? Your doctor tells you not to drink. ? You are pregnant, may be pregnant, or are planning to become pregnant.  If you drink alcohol: ? Limit how much you use to:  0-1 drink a day for women.  0-2 drinks a day for men. ? Be aware of how much alcohol is in your drink. In the [place] one drink equals one 12 oz bottle of beer (355 mL), one 5 oz glass of wine (148 mL), or one 1 oz glass of hard liquor (44 mL). General instructions  Keep a weight-loss journal. This can help you keep track of: ? The food that you eat. ? How much exercise you get.  Take over-the-counter and prescription medicines only as told by your doctor.  Take vitamins and supplements only as told by your doctor.  Think about joining a support group.  Keep all follow-up visits as told by your doctor. This is important. Contact a doctor if:  You cannot meet your weight loss goal after you have changed your diet and lifestyle for 6 weeks. Get help right away if you:  Are having trouble breathing.  Are having thoughts of harming yourself. Summary  Obesity is having too much body fat.  Being obese means that your weight is more than what is healthy for you.  Work with your doctor to set a weight-loss goal.  Get regular exercise as told by your doctor. This information is not intended to replace advice given to you by your health care provider. Make sure you discuss any questions you have with your health care provider. Document Revised: 05/28/2018 Document Reviewed: 05/28/2018 Elsevier Patient Education  2020 [employer].

## 2020-08-14 NOTE — Progress Notes (Signed)
Patient ID: [name], female    DOB: 12-May-1956  MRN: [id number]  CC: Follow-up and Hypertension   Subjective: [name] is a 64 y.o. female who presents for chronic ds management. Her concerns today include:  Patient with history of HTN, CVA 2009, CKD4.   HL:  Reports that pharmacy has not been giving her the Lipitor.  Has not had it since July of this yr  HYPERTENSION Currently taking: see medication list - Hydralazine increased to 75 mg TID; Some days she may miss a dose of Furosemide.  Reports compliance with Norvasc, Labetalol and Isosorbide. Med Adherence: [x]  Yes    []  No Medication side effects: []  Yes    [x]  No Adherence with salt restriction: [x]  Yes    []  No Home Monitoring?: [x]  Yes    []  No Monitoring Frequency: occasionally Home BP results range: 130/85, 140s/90s SOB? []  Yes    [x]  No Chest Pain?: []  Yes    [x]  No Leg swelling?: []  Yes    [x]  No Headaches?: []  Yes    [x]  No Dizziness? []  Yes    [x]  No Comments:   CKD4:  Followed by nephrology at [facility].  Called to reschedule appt but did not keep appt due to lack of transportation that day.  Obesity:  Gained 11 lbs since 12/2019. Use to walk a few times a wk but not since 05/2020.  Thinks she had flu late August to early Sept. Also the pollen was bothering her.  -feels portion sizes not that bad.  Drinks a lot of juices and water.   HM:  We never received the FIT test that she mailed.  Due for flu shot.  Completed COVID vaccine series  Patient Active Problem List   Diagnosis Date Noted  . CKD (chronic kidney disease) stage 4, GFR 15-29 ml/min (HCC) 03/23/2020  . LGSIL on Pap smear of cervix 12/29/2019  . CKD (chronic kidney disease) stage 3, GFR 30-59 ml/min (HCC) 08/14/2018  . Essential hypertension 05/15/2018  . Reactive depression 05/15/2018  . History of CVA (cerebrovascular accident) without residual deficits 05/15/2018     Current Outpatient Medications on File Prior to Visit  Medication  Sig Dispense Refill  . amLODipine (NORVASC) 10 MG tablet Take 1 tablet (10 mg total) by mouth daily. 30 tablet 4  . furosemide (LASIX) 40 MG tablet Take 1 tablet (40 mg total) by mouth 2 (two) times daily. 60 tablet 4  . isosorbide dinitrate (ISORDIL) 20 MG tablet Take 1 tablet (20 mg total) by mouth 3 (three) times daily. 90 tablet 4   No current facility-administered medications on file prior to visit.    No Known Allergies  Social History   Socioeconomic History  . Marital status: Married    Spouse name: Not on file  . Number of children: Not on file  . Years of education: Not on file  . Highest education level: Not on file  Occupational History  . Occupation: retired Therapist, sports  Tobacco Use  . Smoking status: Never Smoker  . Smokeless tobacco: Never Used  Vaping Use  . Vaping Use: Never used  Substance and Sexual Activity  . Alcohol use: No  . Drug use: Not on file  . Sexual activity: Not Currently  Other Topics Concern  . Not on file  Social History Narrative  . Not on file   Social Determinants of Health   Financial Resource Strain:   . Difficulty of Paying Living Expenses: Not on file  Food Insecurity:   . Worried About Charity fundraiser in the Last Year: Not on file  . Ran Out of Food in the Last Year: Not on file  Transportation Needs:   . Lack of Transportation (Medical): Not on file  . Lack of Transportation (Non-Medical): Not on file  Physical Activity:   . Days of Exercise per Week: Not on file  . Minutes of Exercise per Session: Not on file  Stress:   . Feeling of Stress : Not on file  Social Connections:   . Frequency of Communication with Friends and Family: Not on file  . Frequency of Social Gatherings with Friends and Family: Not on file  . Attends Religious Services: Not on file  . Active Member of Clubs or Organizations: Not on file  . Attends Archivist Meetings: Not on file  . Marital Status: Not on file  Intimate Partner Violence:     . Fear of Current or Ex-Partner: Not on file  . Emotionally Abused: Not on file  . Physically Abused: Not on file  . Sexually Abused: Not on file    Family History  Problem Relation Age of Onset  . Stroke Mother     Past Surgical History:  Procedure Laterality Date  . CESAREAN SECTION      ROS: Review of Systems Negative except as stated above  PHYSICAL EXAM: BP (!) 146/86   Pulse 70   Temp 97.9 F (36.6 C) (Oral)   Resp 16   Wt 239 lb (108.4 kg)   SpO2 96%   BMI 44.43 kg/m   Wt Readings from Last 3 Encounters:  08/14/20 239 lb (108.4 kg)  12/23/19 228 lb 3.2 oz (103.5 kg)  11/05/18 218 lb 9.6 oz (99.2 kg)    Physical Exam  General appearance - alert, well appearing, obese AAF and in no distress Mental status - normal mood, behavior, speech, dress, motor activity, and thought processes Neck - supple, no significant adenopathy Chest - clear to auscultation, no wheezes, rales or rhonchi, symmetric air entry Heart - normal rate, regular rhythm, normal S1, S2, no murmurs, rubs, clicks or gallops Extremities - trace LE edema   CMP Latest Ref Rng & Units 11/17/2019 05/15/2018 01/06/2014  Glucose 65 - 99 mg/dL 110(H) 109(H) 113(H)  BUN 8 - 27 mg/dL 25 40(H) 27(H)  Creatinine 0.57 - 1.00 mg/dL 2.08(H) 2.47(H) 1.84(H)  Sodium 134 - 144 mmol/L 144 139 142  Potassium 3.5 - 5.2 mmol/L 4.3 4.3 3.8  Chloride 96 - 106 mmol/L 106 105 105  CO2 20 - 29 mmol/L 23 21 23   Calcium 8.7 - 10.3 mg/dL 9.5 9.9 9.6  Total Protein 6.0 - 8.5 g/dL 7.1 6.9 -  Total Bilirubin 0.0 - 1.2 mg/dL 0.5 0.3 -  Alkaline Phos 39 - 117 IU/L 71 64 -  AST 0 - 40 IU/L 17 15 -  ALT 0 - 32 IU/L 13 14 -   Lipid Panel     Component Value Date/Time   CHOL 195 11/17/2019 1211   TRIG 52 11/17/2019 1211   HDL 100 11/17/2019 1211   CHOLHDL 2.0 11/17/2019 1211   CHOLHDL 2.2 Ratio 11/29/2010 2115   VLDL 13 11/29/2010 2115   LDLCALC 85 11/17/2019 1211   LDLDIRECT 108 (H) 05/07/2010 1841    CBC     Component Value Date/Time   WBC 3.8 11/17/2019 1211   WBC 4.1 01/06/2014 1802   RBC 4.30 11/17/2019 1211   RBC 3.83 (L)  01/06/2014 1802   HGB 12.5 11/17/2019 1211   HCT 38.2 11/17/2019 1211   PLT 183 11/17/2019 1211   MCV 89 11/17/2019 1211   MCH 29.1 11/17/2019 1211   MCH 30.0 01/06/2014 1802   MCHC 32.7 11/17/2019 1211   MCHC 33.6 01/06/2014 1802   RDW 13.2 11/17/2019 1211   LYMPHSABS 1.7 01/06/2014 1802   MONOABS 0.4 01/06/2014 1802   EOSABS 0.2 01/06/2014 1802   BASOSABS 0.0 01/06/2014 1802    ASSESSMENT AND PLAN:  1. Essential hypertension Not at goal. Increase Hydralazine to 100 mg TID.  Continue other meds.  Advised to check BP 2 x a wk with goal being 130/80 or lower - Comprehensive metabolic panel - hydrALAZINE (APRESOLINE) 100 MG tablet; Take 1 tablet (100 mg total) by mouth 3 (three) times daily.  Dispense: 90 tablet; Refill: 6  2. CKD (chronic kidney disease) stage 4, GFR 15-29 ml/min (HCC) Encourage f/u with nephrology. Will resubmit referral Reminded of importance of good BP control to preserve kidney function.  No NSAIDs - Ambulatory referral to Nephrology  3. Morbid obesity with BMI of 40.0-44.9, adult [facility]) Discussed and encourage healthy eating habits. Now that it is the fall and the weather is not hot and pollen count is lower, I have encouraged her to try to get in some exercise at least 3 to 4 days a week.  4. Mixed hyperlipidemia Initially I told pt to hold off on refilling Lipitor until we recheck lipid profile.  However, I called pt back and told her to restart Lipitor given hx of CVA - Lipid panel  5. Screening for colon cancer Pt will to do FIT test again.  She will bring it in to [place] once used - Fecal occult blood, imunochemical([facility])  6. Need for immunization against influenza - Flu Vaccine QUAD 36+ mos IM   Patient was given the opportunity to ask questions.  Patient verbalized understanding of the plan and was able to repeat  key elements of the plan.   Orders Placed This Encounter  Procedures  . Fecal occult blood, imunochemical([facility])  . Flu Vaccine QUAD 36+ mos IM  . Comprehensive metabolic panel  . Lipid panel  . Ambulatory referral to Nephrology     Requested Prescriptions   Signed Prescriptions Disp Refills  . hydrALAZINE (APRESOLINE) 100 MG tablet 90 tablet 6    Sig: Take 1 tablet (100 mg total) by mouth 3 (three) times daily.    Return in about 4 months (around 12/12/2020) for In-person per Dr. Wynetta Emery. Karle Plumber, MD, FACP

## 2020-08-14 NOTE — Progress Notes (Signed)
Follow up HTN 

## 2020-08-14 NOTE — Telephone Encounter (Signed)
Requested Prescriptions  Pending Prescriptions Disp Refills   labetalol (NORMODYNE) 200 MG tablet [Pharmacy Med Name: LABETALOL HCL 200 MG TABS 200 Tablet] 90 tablet 4    Sig: TAKE 1 TABLET (200 MG TOTAL) BY MOUTH 3 (THREE) TIMES DAILY.     Cardiovascular:  Beta Blockers Failed - 08/14/2020 11:57 AM      Failed - Last BP in normal range    BP Readings from Last 1 Encounters:  08/14/20 (!) 156/80         Passed - Last Heart Rate in normal range    Pulse Readings from Last 1 Encounters:  08/14/20 70         Passed - Valid encounter within last 6 months    Recent Outpatient Visits          Today Essential hypertension   [place], MD   4 months ago Essential hypertension   [place], [place]   7 months ago Pap smear for cervical cancer screening   [place], [place]   9 months ago Essential hypertension   [place], Deborah B, MD   1 year ago Essential hypertension   [place], [place]

## 2020-08-15 ENCOUNTER — Ambulatory Visit: Payer: Self-pay | Attending: Internal Medicine

## 2020-08-15 LAB — COMPREHENSIVE METABOLIC PANEL
ALT: 15 IU/L (ref 0–32)
AST: 21 IU/L (ref 0–40)
Albumin/Globulin Ratio: 1.3 (ref 1.2–2.2)
Albumin: 4.3 g/dL (ref 3.8–4.8)
Alkaline Phosphatase: 65 IU/L (ref 44–121)
BUN/Creatinine Ratio: 20 (ref 12–28)
BUN: 39 mg/dL — ABNORMAL HIGH (ref 8–27)
Bilirubin Total: 0.4 mg/dL (ref 0.0–1.2)
CO2: 23 mmol/L (ref 20–29)
Calcium: 9.4 mg/dL (ref 8.7–10.3)
Chloride: 103 mmol/L (ref 96–106)
Creatinine, Ser: 1.99 mg/dL — ABNORMAL HIGH (ref 0.57–1.00)
GFR calc Af Amer: 30 mL/min/{1.73_m2} — ABNORMAL LOW (ref >59)
GFR calc non Af Amer: 26 mL/min/{1.73_m2} — ABNORMAL LOW (ref >59)
Globulin, Total: 3.2 g/dL (ref 1.5–4.5)
Glucose: 126 mg/dL — ABNORMAL HIGH (ref 65–99)
Potassium: 4.6 mmol/L (ref 3.5–5.2)
Sodium: 140 mmol/L (ref 134–144)
Total Protein: 7.5 g/dL (ref 6.0–8.5)

## 2020-08-15 LAB — LIPID PANEL
Chol/HDL Ratio: 2.7 ratio (ref 0.0–4.4)
Cholesterol, Total: 255 mg/dL — ABNORMAL HIGH (ref 100–199)
HDL: 94 mg/dL (ref >39)
LDL Chol Calc (NIH): 152 mg/dL — ABNORMAL HIGH (ref 0–99)
Triglycerides: 59 mg/dL (ref 0–149)
VLDL Cholesterol Cal: 9 mg/dL (ref 5–40)

## 2020-08-16 ENCOUNTER — Telehealth: Payer: Self-pay

## 2020-08-16 NOTE — Progress Notes (Signed)
Kidney function not 100% but stable compared to when it was last checked in February of this year.Liver function tests normal.LDL cholesterol is 152 with goal being less than 70 in patients with history of stroke.  Please take the Lipitor as prescribed.

## 2020-08-16 NOTE — Telephone Encounter (Signed)
Contacted pt to go over lab results pt is aware

## 2020-08-17 LAB — FECAL OCCULT BLOOD, IMMUNOCHEMICAL: Fecal Occult Bld: NEGATIVE

## 2020-08-17 NOTE — Progress Notes (Signed)
Normal result letter generated and mailed to address on file.

## 2020-09-05 MED FILL — ?FUROSEMIDE 4OMG TABLETS: 40 | 30 days supply | Qty: 60 | Fill #2

## 2020-09-13 MED FILL — hydrALAZINE HCL 100 MG TABS: 100 | 30 days supply | Qty: 90 | Fill #1

## 2020-09-18 ENCOUNTER — Ambulatory Visit: Payer: Self-pay | Admitting: Internal Medicine

## 2020-09-28 MED FILL — ISOSORBIDE DINITRATE 20 MG: 20 | 30 days supply | Qty: 90 | Fill #4

## 2020-09-28 MED FILL — AMLODIPINE BESYLATE 10 MG T: 10 | 30 days supply | Qty: 30 | Fill #2

## 2020-09-28 MED FILL — ?ATORVASTATIN 40MG TABLET: 40 | 30 days supply | Qty: 30 | Fill #1

## 2020-09-28 MED FILL — LABETALOL HCL 200 MG TABS: 200 | 30 days supply | Qty: 90 | Fill #1

## 2020-10-02 ENCOUNTER — Ambulatory Visit (HOSPITAL_COMMUNITY)
Admission: EM | Admit: 2020-10-02 | Discharge: 2020-10-02 | Disposition: A | Discharge status: Home / Self Care | Payer: Self-pay | Attending: Physician Assistant | Admitting: Physician Assistant

## 2020-10-02 ENCOUNTER — Other Ambulatory Visit: Payer: Self-pay

## 2020-10-02 ENCOUNTER — Encounter (HOSPITAL_COMMUNITY): Payer: Self-pay | Admitting: Emergency Medicine

## 2020-10-02 DIAGNOSIS — I10 Essential (primary) hypertension: Secondary | ICD-10-CM

## 2020-10-02 DIAGNOSIS — Z760 Encounter for issue of repeat prescription: Secondary | ICD-10-CM

## 2020-10-02 MED ORDER — LABETALOL HCL 200 MG PO TABS: 200.0000 mg | ORAL_TABLET | Freq: Three times a day (TID) | ORAL | 0 refills | Status: DC

## 2020-10-02 NOTE — ED Triage Notes (Signed)
Pt states that [facility]. Pt states that she needs a refill on Labetalol 200mg  TID. Pt denies dizziness, blurred vision, CP

## 2020-10-02 NOTE — ED Provider Notes (Signed)
[place]    CSN: [id number] Arrival date & time: 10/02/20  0935      History   Chief Complaint Chief Complaint  Patient presents with   Medication Refill    HPI [name] is a 64 y.o. female.   Patient here c/w medication refill, she is taking labetalol 200 mg TID.  She has been out of medication for 3 days now.  She reports her PCP / pharmacy is clsoed and unable to get the medications.  She denies HA, vision changes, CP, LE edema, SOB, n/t, weakness.       Past Medical History:  Diagnosis Date   Hypertension     Patient Active Problem List   Diagnosis Date Noted   CKD (chronic kidney disease) stage 4, GFR 15-29 ml/min ([place]) 03/23/2020   LGSIL on Pap smear of cervix 12/29/2019   CKD (chronic kidney disease) stage 3, GFR 30-59 ml/min ([place]) 08/14/2018   Essential hypertension 05/15/2018   Reactive depression 05/15/2018   History of CVA (cerebrovascular accident) without residual deficits 05/15/2018    Past Surgical History:  Procedure Laterality Date   CESAREAN SECTION      OB History   No obstetric history on file.      Home Medications    Prior to Admission medications   Medication Sig Start Date End Date Taking? Authorizing Provider  amLODipine (NORVASC) 10 MG tablet Take 1 tablet (10 mg total) by mouth daily. 04/11/20  Yes Ladell Pier, MD  atorvastatin (LIPITOR) 40 MG tablet Take 1 tablet (40 mg total) by mouth daily. 08/14/20  Yes Ladell Pier, MD  furosemide (LASIX) 40 MG tablet Take 1 tablet (40 mg total) by mouth 2 (two) times daily. 04/11/20  Yes Ladell Pier, MD  hydrALAZINE (APRESOLINE) 100 MG tablet Take 1 tablet (100 mg total) by mouth 3 (three) times daily. 08/14/20  Yes Ladell Pier, MD  isosorbide dinitrate (ISORDIL) 20 MG tablet Take 1 tablet (20 mg total) by mouth 3 (three) times daily. 04/11/20  Yes Ladell Pier, MD  labetalol (NORMODYNE) 200 MG tablet TAKE 1 TABLET (200 MG TOTAL) BY  MOUTH 3 (THREE) TIMES DAILY. 08/14/20  Yes Ladell Pier, MD  labetalol (NORMODYNE) 200 MG tablet Take 1 tablet (200 mg total) by mouth 3 (three) times daily for 14 days. 10/02/20 10/16/20 Yes Peri Jefferson, PA-C    Family History Family History  Problem Relation Age of Onset   Stroke Mother     Social History Social History   Tobacco Use   Smoking status: Never Smoker   Smokeless tobacco: Never Used  Scientific laboratory technician Use: Never used  Substance Use Topics   Alcohol use: No     Allergies   Patient has no known allergies.   Review of Systems Review of Systems  Constitutional: Negative for chills and fever.  Eyes: Negative for pain and visual disturbance.  Respiratory: Negative for cough and shortness of breath.   Cardiovascular: Negative for chest pain, palpitations and leg swelling.  Gastrointestinal: Negative for abdominal pain, nausea and vomiting.  Musculoskeletal: Negative for arthralgias, back pain and myalgias.  Skin: Negative for color change and rash.  Neurological: Negative for dizziness, seizures, syncope, weakness, light-headedness, numbness and headaches.  Psychiatric/Behavioral: Negative for sleep disturbance.  All other systems reviewed and are negative.    Physical Exam Triage Vital Signs ED Triage Vitals  Enc Vitals Group     BP 10/02/20 1116 (!) 178/110  Pulse Rate 10/02/20 1116 98     Resp 10/02/20 1116 20     Temp 10/02/20 1116 98.2 F (36.8 C)     Temp Source 10/02/20 1116 Oral     SpO2 10/02/20 1116 96 %     Weight --      Height --      Head Circumference --      Peak Flow --      Pain Score 10/02/20 1112 0     Pain Loc --      Pain Edu? --      Excl. in [place]? --    No data found.  Updated Vital Signs BP (S) (!) 194/83 (BP Location: Right Wrist) Comment: rechecked BP   Pulse 98    Temp 98.2 F (36.8 C) (Oral)    Resp 20    SpO2 96%   Visual Acuity Right Eye Distance:   Left Eye Distance:   Bilateral Distance:     Right Eye Near:   Left Eye Near:    Bilateral Near:     Physical Exam Vitals and nursing note reviewed.  Constitutional:      General: She is not in acute distress.    Appearance: She is well-developed and well-nourished. She is obese. She is not ill-appearing.  HENT:     Head: Normocephalic and atraumatic.     Nose: Nose normal.  Eyes:     General: No scleral icterus.    Extraocular Movements: Extraocular movements intact.     Conjunctiva/sclera: Conjunctivae normal.  Cardiovascular:     Rate and Rhythm: Normal rate and regular rhythm.     Heart sounds: No murmur heard.   Pulmonary:     Effort: Pulmonary effort is normal. No respiratory distress.     Breath sounds: Normal breath sounds.  Musculoskeletal:        General: No edema.     Cervical back: Normal range of motion and neck supple. No rigidity.     Right lower leg: No edema.     Left lower leg: No edema.  Skin:    General: Skin is warm and dry.     Capillary Refill: Capillary refill takes less than 2 seconds.  Neurological:     General: No focal deficit present.     Mental Status: She is alert and oriented to person, place, and time.     Cranial Nerves: No cranial nerve deficit.     Motor: No weakness.     Gait: Gait normal.  Psychiatric:        Mood and Affect: Mood and affect and mood normal.        Behavior: Behavior normal.      UC Treatments / Results  Labs (all labs ordered are listed, but only abnormal results are displayed) Labs Reviewed - No data to display  EKG   Radiology No results found.  Procedures Procedures (including critical care time)  Medications Ordered in UC Medications - No data to display  Initial Impression / Assessment and Plan / UC Course  I have reviewed the triage vital signs and the nursing notes.  Pertinent labs & imaging results that were available during my care of the patient were reviewed by me and considered in my medical decision making (see chart for  details).     Refill medication for 2 weeks, follow up with PCP  Strict ED precautions provided. Final Clinical Impressions(s) / UC Diagnoses   Final diagnoses:  Medication refill  Hypertension, unspecified type     Discharge Instructions     Go to Emergency room with any worsening symptoms, including headache, numbness or tingling, weakness, vision changes, or chest pain    ED Prescriptions    Medication Sig Dispense Auth. Provider   labetalol (NORMODYNE) 200 MG tablet Take 1 tablet (200 mg total) by mouth 3 (three) times daily for 14 days. 42 tablet Peri Jefferson, PA-C     PDMP not reviewed this encounter.   Peri Jefferson, PA-C 10/02/20 1131

## 2020-10-02 NOTE — Discharge Instructions (Addendum)
Go to Emergency room with any worsening symptoms, including headache, numbness or tingling, weakness, vision changes, or chest pain

## 2020-10-09 MED FILL — hydrALAZINE HCL 100 MG TABS: 100 | 30 days supply | Qty: 90 | Fill #2

## 2020-11-09 MED FILL — LABETALOL HCL 200 MG TABS: 200 | 30 days supply | Qty: 90 | Fill #2

## 2020-11-09 MED FILL — AMLODIPINE BESYLATE 10 MG T: 10 | 30 days supply | Qty: 30 | Fill #3

## 2020-11-09 MED FILL — hydrALAZINE HCL 100 MG TABS: 100 | 30 days supply | Qty: 90 | Fill #3

## 2020-11-09 MED FILL — ?FUROSEMIDE 40 MG TABLET: 40 | 30 days supply | Qty: 60 | Fill #3

## 2020-11-09 MED FILL — ATORVASTATIN CALCIUM 40 MG: 40 | 30 days supply | Qty: 30 | Fill #2

## 2020-11-09 MED FILL — ISOSORBIDE DINITRATE 20 MG: 20 | 30 days supply | Qty: 90 | Fill #2

## 2020-11-22 ENCOUNTER — Other Ambulatory Visit: Payer: Self-pay

## 2020-11-22 ENCOUNTER — Ambulatory Visit: Payer: Self-pay | Attending: Internal Medicine

## 2020-11-29 ENCOUNTER — Ambulatory Visit: Payer: Self-pay

## 2020-12-02 IMAGING — MG DIGITAL SCREENING BILAT W/ TOMO W/ CAD
8 series · 8 of 24 positions shown · non-contrast
Comparison: Previous exam(s).

CLINICAL DATA: Screening.

EXAM:
DIGITAL SCREENING BILATERAL MAMMOGRAM WITH TOMO AND CAD

[R CC synth-2D]
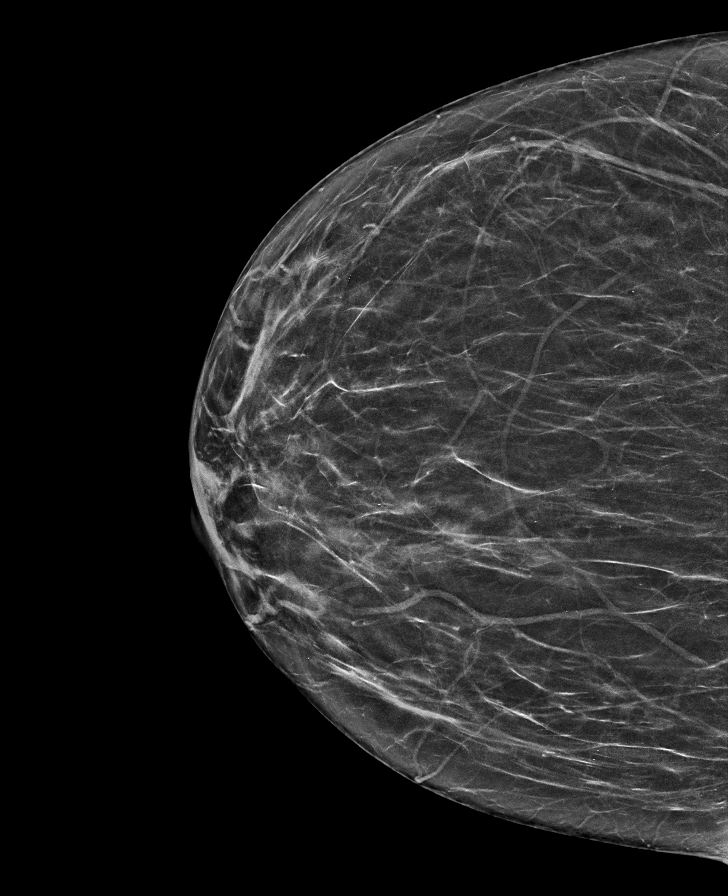

[L MLO synth-2D]
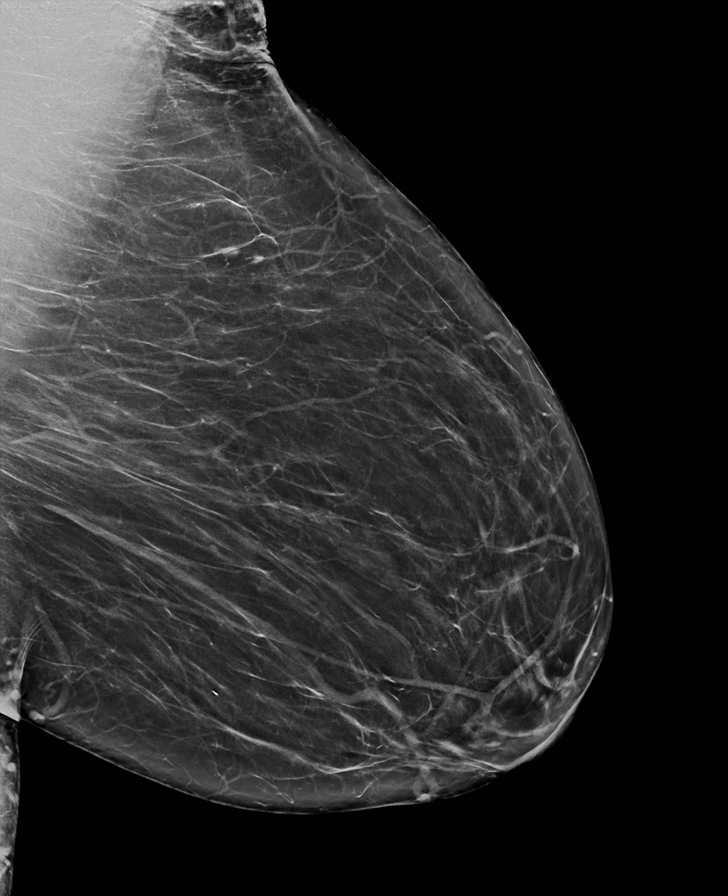

[L CC synth-2D]
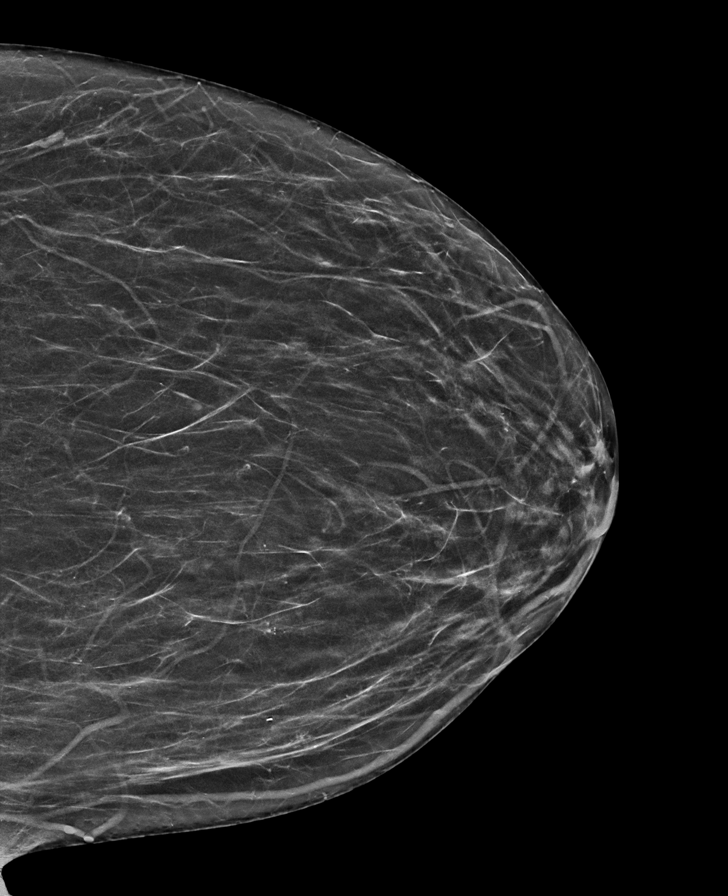

[R MLO synth-2D]
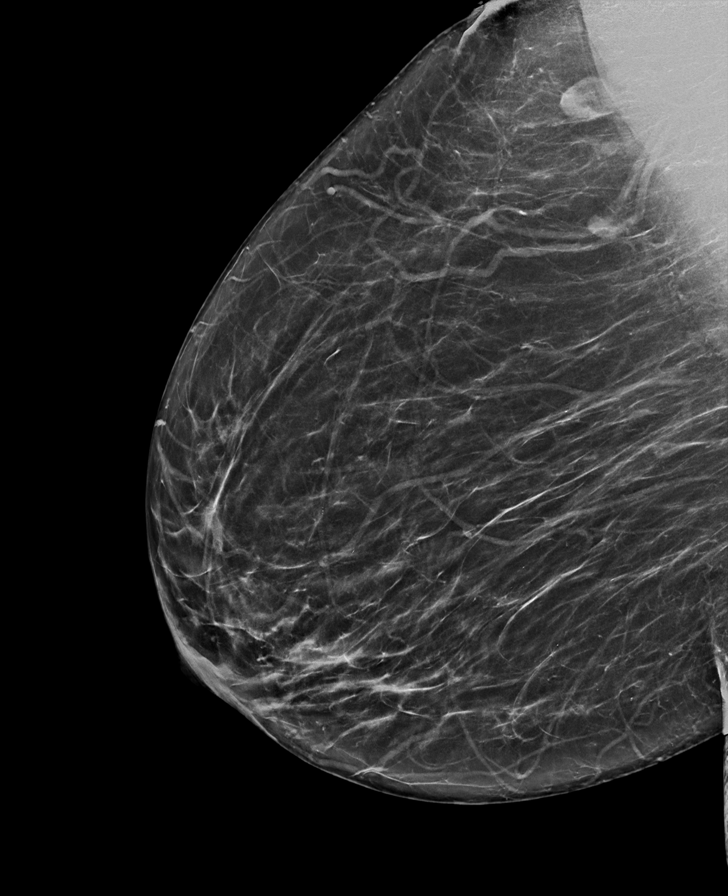

[L CC tomo · tomo slice 29/56.0]
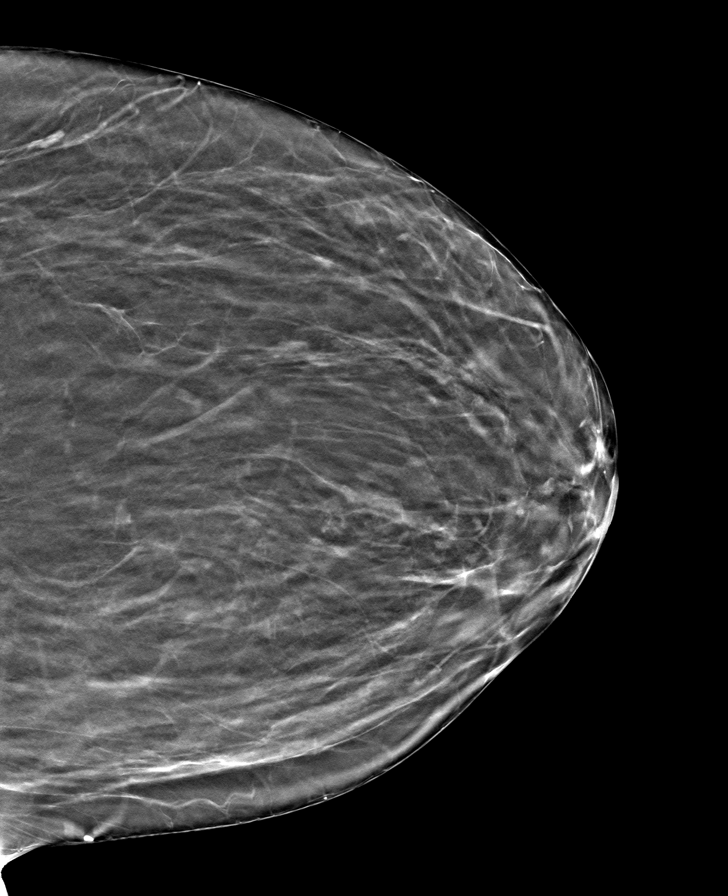

[R CC tomo · tomo slice 31/62.0]
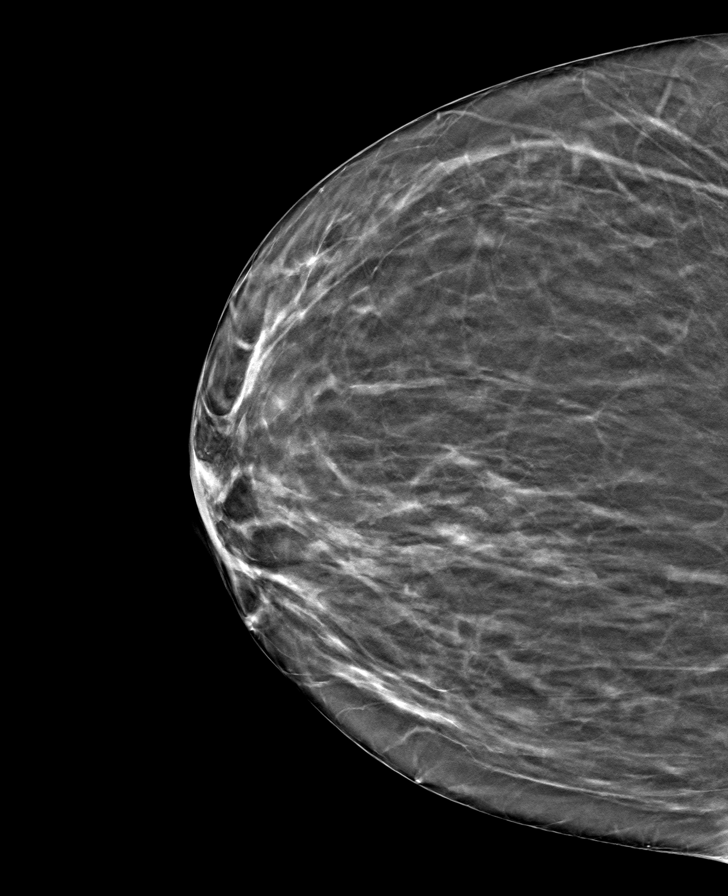

[L MLO tomo · tomo slice 37/73.0]
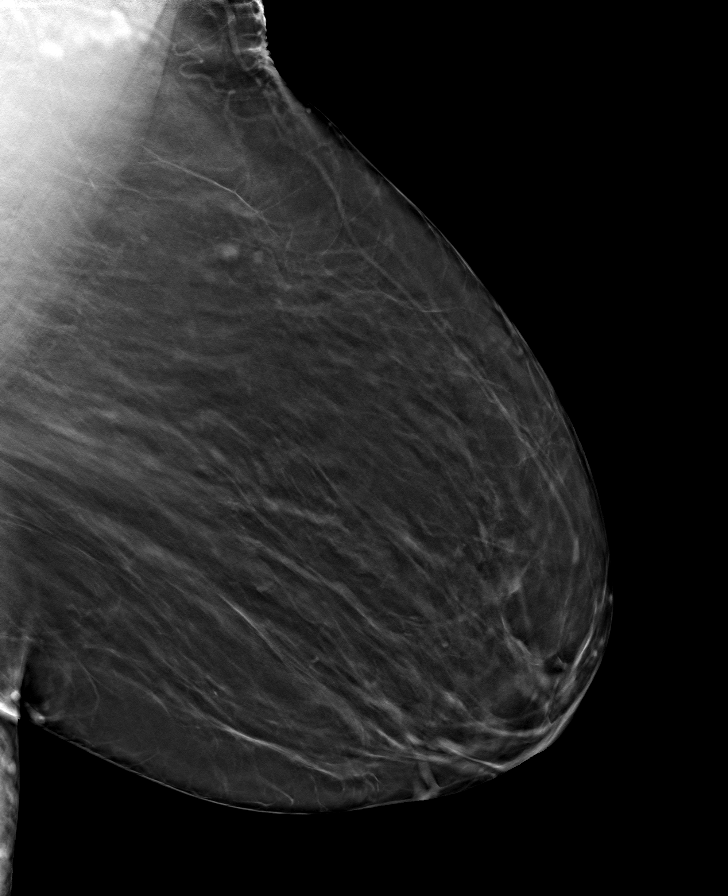

[R MLO tomo · tomo slice 38/75.0]
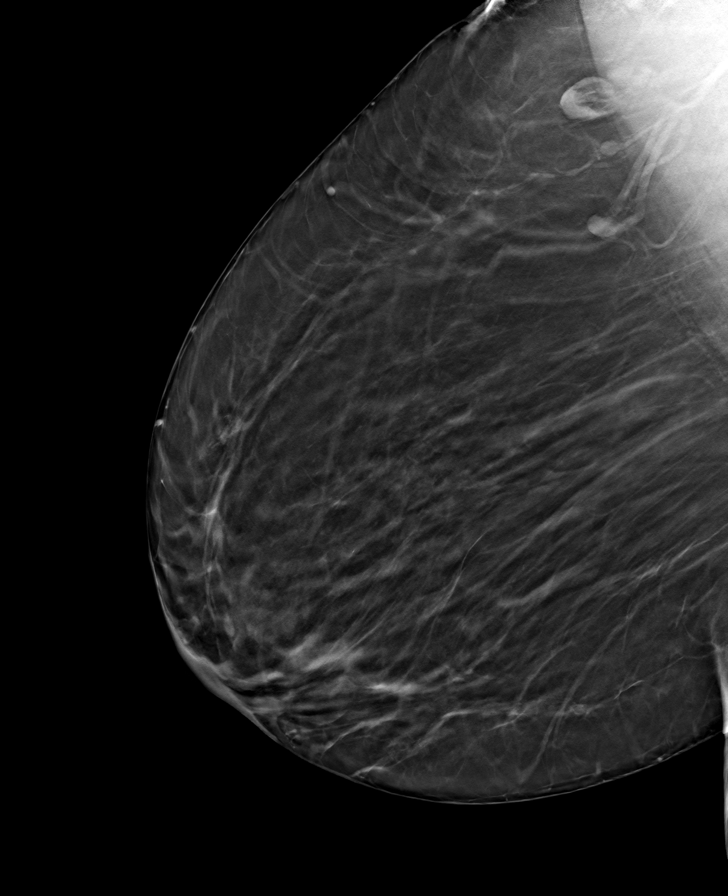

[8 of 24 positions shown; findings below may reference images not displayed]

ACR Breast Density Category b: There are scattered areas of
fibroglandular density.
FINDINGS: There are no findings suspicious for malignancy. Images were
processed with CAD.
IMPRESSION: No mammographic evidence of malignancy. A result letter of this
screening mammogram will be mailed directly to the patient.

RECOMMENDATION:
Screening mammogram in one year. (Code:[id number])

BI-RADS CATEGORY  1: Negative.

## 2020-12-04 ENCOUNTER — Ambulatory Visit: Payer: Self-pay | Attending: Internal Medicine

## 2020-12-04 ENCOUNTER — Other Ambulatory Visit: Payer: Self-pay

## 2020-12-12 ENCOUNTER — Other Ambulatory Visit: Payer: Self-pay

## 2020-12-12 ENCOUNTER — Ambulatory Visit: Payer: Self-pay | Attending: Internal Medicine | Admitting: Internal Medicine

## 2020-12-12 ENCOUNTER — Encounter: Payer: Self-pay | Admitting: Internal Medicine

## 2020-12-12 VITALS — BP 140/80 | HR 75 | Resp 16 | Wt 234.4 lb

## 2020-12-12 DIAGNOSIS — R739 Hyperglycemia, unspecified: Secondary | ICD-10-CM

## 2020-12-12 DIAGNOSIS — I10 Essential (primary) hypertension: Secondary | ICD-10-CM

## 2020-12-12 DIAGNOSIS — Z6841 Body Mass Index (BMI) 40.0 and over, adult: Secondary | ICD-10-CM

## 2020-12-12 DIAGNOSIS — Z8742 Personal history of other diseases of the female genital tract: Secondary | ICD-10-CM | POA: Insufficient documentation

## 2020-12-12 DIAGNOSIS — Z124 Encounter for screening for malignant neoplasm of cervix: Secondary | ICD-10-CM

## 2020-12-12 DIAGNOSIS — E782 Mixed hyperlipidemia: Secondary | ICD-10-CM

## 2020-12-12 DIAGNOSIS — N950 Postmenopausal bleeding: Secondary | ICD-10-CM

## 2020-12-12 DIAGNOSIS — N1832 Chronic kidney disease, stage 3b: Secondary | ICD-10-CM

## 2020-12-12 NOTE — Patient Instructions (Signed)
Healthy Eating Following a healthy eating pattern may help you to achieve and maintain a healthy body weight, reduce the risk of chronic disease, and live a long and productive life. It is important to follow a healthy eating pattern at an appropriate calorie level for your body. Your nutritional needs should be met primarily through food by choosing a variety of nutrient-rich foods. What are tips for following this plan? Reading food labels  Read labels and choose the following: ? Reduced or low sodium. ? Juices with 100% fruit juice. ? Foods with low saturated fats and high polyunsaturated and monounsaturated fats. ? Foods with whole grains, such as whole wheat, cracked wheat, brown rice, and wild rice. ? Whole grains that are fortified with folic acid. This is recommended for women who are pregnant or who want to become pregnant.  Read labels and avoid the following: ? Foods with a lot of added sugars. These include foods that contain brown sugar, corn sweetener, corn syrup, dextrose, fructose, glucose, high-fructose corn syrup, honey, invert sugar, lactose, malt syrup, maltose, molasses, raw sugar, sucrose, trehalose, or turbinado sugar.  Do not eat more than the following amounts of added sugar per day:  6 teaspoons (25 g) for women.  9 teaspoons (38 g) for men. ? Foods that contain processed or refined starches and grains. ? Refined grain products, such as white flour, degermed cornmeal, white bread, and white rice. Shopping  Choose nutrient-rich snacks, such as vegetables, whole fruits, and nuts. Avoid high-calorie and high-sugar snacks, such as potato chips, fruit snacks, and candy.  Use oil-based dressings and spreads on foods instead of solid fats such as butter, stick margarine, or cream cheese.  Limit pre-made sauces, mixes, and "instant" products such as flavored rice, instant noodles, and ready-made pasta.  Try more plant-protein sources, such as tofu, tempeh, black beans,  edamame, lentils, nuts, and seeds.  Explore eating plans such as the Mediterranean diet or vegetarian diet. Cooking  Use oil to saut or stir-fry foods instead of solid fats such as butter, stick margarine, or lard.  Try baking, boiling, grilling, or broiling instead of frying.  Remove the fatty part of meats before cooking.  Steam vegetables in water or broth. Meal planning  At meals, imagine dividing your plate into fourths: ? One-half of your plate is fruits and vegetables. ? One-fourth of your plate is whole grains. ? One-fourth of your plate is protein, especially lean meats, poultry, eggs, tofu, beans, or nuts.  Include low-fat dairy as part of your daily diet.   Lifestyle  Choose healthy options in all settings, including home, work, school, restaurants, or stores.  Prepare your food safely: ? Wash your hands after handling raw meats. ? Keep food preparation surfaces clean by regularly washing with hot, soapy water. ? Keep raw meats separate from ready-to-eat foods, such as fruits and vegetables. ? Cook seafood, meat, poultry, and eggs to the recommended internal temperature. ? Store foods at safe temperatures. In general:  Keep cold foods at 7F (4.4C) or below.  Keep hot foods at 17F (60C) or above.  Keep your freezer at [facility] (-17.8C) or below.  Foods are no longer safe to eat when they have been between the temperatures of 40-17F (4.4-60C) for more than 2 hours. What foods should I eat? Fruits Aim to eat 2 cup-equivalents of fresh, canned (in natural juice), or frozen fruits each day. Examples of 1 cup-equivalent of fruit include 1 small apple, 8 large strawberries, 1 cup canned fruit,  cup dried fruit, or 1 cup 100% juice. Vegetables Aim to eat 2-3 cup-equivalents of fresh and frozen vegetables each day, including different varieties and colors. Examples of 1 cup-equivalent of vegetables include 2 medium carrots, 2 cups raw, leafy greens, 1 cup chopped  vegetable (raw or cooked), or 1 medium baked potato. Grains Aim to eat 6 ounce-equivalents of whole grains each day. Examples of 1 ounce-equivalent of grains include 1 slice of bread, 1 cup ready-to-eat cereal, 3 cups popcorn, or  cup cooked rice, pasta, or cereal. Meats and other proteins Aim to eat 5-6 ounce-equivalents of protein each day. Examples of 1 ounce-equivalent of protein include 1 egg, 1/2 cup nuts or seeds, or 1 tablespoon (16 g) peanut butter. A cut of meat or fish that is the size of a deck of cards is about 3-4 ounce-equivalents.  Of the protein you eat each week, try to have at least 8 ounces come from seafood. This includes salmon, trout, herring, and anchovies. Dairy Aim to eat 3 cup-equivalents of fat-free or low-fat dairy each day. Examples of 1 cup-equivalent of dairy include 1 cup (240 mL) milk, 8 ounces (250 g) yogurt, 1 ounces (44 g) natural cheese, or 1 cup (240 mL) fortified soy milk. Fats and oils  Aim for about 5 teaspoons (21 g) per day. Choose monounsaturated fats, such as canola and olive oils, avocados, peanut butter, and most nuts, or polyunsaturated fats, such as sunflower, corn, and soybean oils, walnuts, pine nuts, sesame seeds, sunflower seeds, and flaxseed. Beverages  Aim for six 8-oz glasses of water per day. Limit coffee to three to five 8-oz cups per day.  Limit caffeinated beverages that have added calories, such as soda and energy drinks.  Limit alcohol intake to no more than 1 drink a day for nonpregnant women and 2 drinks a day for men. One drink equals 12 oz of beer (355 mL), 5 oz of wine (148 mL), or 1 oz of hard liquor (44 mL). Seasoning and other foods  Avoid adding excess amounts of salt to your foods. Try flavoring foods with herbs and spices instead of salt.  Avoid adding sugar to foods.  Try using oil-based dressings, sauces, and spreads instead of solid fats. This information is based on general [place] nutrition guidelines. For more  information, visit choosemyplate.gov. Exact amounts may vary based on your nutrition needs. Summary  A healthy eating plan may help you to maintain a healthy weight, reduce the risk of chronic diseases, and stay active throughout your life.  Plan your meals. Make sure you eat the right portions of a variety of nutrient-rich foods.  Try baking, boiling, grilling, or broiling instead of frying.  Choose healthy options in all settings, including home, work, school, restaurants, or stores. This information is not intended to replace advice given to you by your health care provider. Make sure you discuss any questions you have with your health care provider. Document Revised: 01/05/2018 Document Reviewed: 01/05/2018 Elsevier Patient Education  2021 Elsevier Inc.  

## 2020-12-12 NOTE — Progress Notes (Addendum)
Patient ID: JURIE DUBROC, female    DOB: 1956-01-04  MRN: [id number]  CC: Hypertension   Subjective: [name] is a 65 y.o. female who presents for chronic ds management Her concerns today include:  Patient with history of HTN, HL, CVA 2009, CKD3-4, obesity.  A little depression about a close cousin who died about 1 mth ago in car accident.  She was unable to go to funeral.  Good emotional support from family/husband.  HYPERTENSION Currently taking: see medication list Med Adherence: '[x]'$  Yes  -I reviewed list with her and she attests to taking all of her medications including the isosorbide. Medication side effects: '[]'$  Yes    '[x]'$  No Adherence with salt restriction: '[x]'$  Yes    '[]'$  No Home Monitoring?: '[x]'$  Yes    '[]'$  No Monitoring Frequency: QOD Home BP results range: 135-140/70s, yesterday was 125/80.  She has the arm cuff SOB? '[]'$  Yes    '[x]'$  No Chest Pain?: '[]'$  Yes    '[x]'$  No Leg swelling?: '[]'$  Yes    '[x]'$  No Headaches?: '[x]'$  Yes    '[]'$  No Dizziness? '[x]'$  Yes    '[]'$  No Comments: She is compliant with and taking the atorvastatin for cholesterol.  Her LDL on last visit was not at goal.  CKD 3-4: She still has not followed up with or receive an appointment with nephrology.  She continues to make good urine.  Her GFR on last visit was 30 putting her at CKD 3 improved from CKD 4.  Obesity:  Started walking and lifting arm wghs almost daily for the past 3 wks. Eating smaller portions Drinks mainly water and grape juices  Last pap 12/2019.  + LGSIL with neg HPV. No vaginal dischg or itching at this time. She endorses some vaginal spotting when she drinks grape juice.  She drank some grape juice recently and has noticed some spotting.  No hematuria.  She is postmenopausal.   Patient Active Problem List   Diagnosis Date Noted  . CKD (chronic kidney disease) stage 4, GFR 15-29 ml/min (HCC) 03/23/2020  . LGSIL on Pap smear of cervix 12/29/2019  . CKD (chronic kidney disease) stage 3,  GFR 30-59 ml/min (HCC) 08/14/2018  . Essential hypertension 05/15/2018  . Reactive depression 05/15/2018  . History of CVA (cerebrovascular accident) without residual deficits 05/15/2018     Current Outpatient Medications on File Prior to Visit  Medication Sig Dispense Refill  . amLODipine (NORVASC) 10 MG tablet Take 1 tablet (10 mg total) by mouth daily. 30 tablet 4  . atorvastatin (LIPITOR) 40 MG tablet Take 1 tablet (40 mg total) by mouth daily. 90 tablet 3  . furosemide (LASIX) 40 MG tablet Take 1 tablet (40 mg total) by mouth 2 (two) times daily. 60 tablet 4  . hydrALAZINE (APRESOLINE) 100 MG tablet Take 1 tablet (100 mg total) by mouth 3 (three) times daily. 90 tablet 6  . isosorbide dinitrate (ISORDIL) 20 MG tablet Take 1 tablet (20 mg total) by mouth 3 (three) times daily. (Patient not taking: Reported on 12/12/2020) 90 tablet 4  . labetalol (NORMODYNE) 200 MG tablet TAKE 1 TABLET (200 MG TOTAL) BY MOUTH 3 (THREE) TIMES DAILY. 90 tablet 4  . labetalol (NORMODYNE) 200 MG tablet Take 1 tablet (200 mg total) by mouth 3 (three) times daily for 14 days. 42 tablet 0   No current facility-administered medications on file prior to visit.    No Known Allergies  Social History   Socioeconomic History  .  Marital status: Married    Spouse name: Not on file  . Number of children: Not on file  . Years of education: Not on file  . Highest education level: Not on file  Occupational History  . Occupation: retired Therapist, sports  Tobacco Use  . Smoking status: Never Smoker  . Smokeless tobacco: Never Used  Vaping Use  . Vaping Use: Never used  Substance and Sexual Activity  . Alcohol use: No  . Drug use: Not on file  . Sexual activity: Not Currently  Other Topics Concern  . Not on file  Social History Narrative  . Not on file   Social Determinants of Health   Financial Resource Strain: Not on file  Food Insecurity: Not on file  Transportation Needs: Not on file  Physical Activity: Not on  file  Stress: Not on file  Social Connections: Not on file  Intimate Partner Violence: Not on file    Family History  Problem Relation Age of Onset  . Stroke Mother     Past Surgical History:  Procedure Laterality Date  . CESAREAN SECTION      ROS: Review of Systems Negative except as stated above  PHYSICAL EXAM: BP 140/80   Pulse 75   Resp 16   Wt 234 lb 6.4 oz (106.3 kg)   SpO2 97%   BMI 43.57 kg/m   Wt Readings from Last 3 Encounters:  12/12/20 234 lb 6.4 oz (106.3 kg)  08/14/20 239 lb (108.4 kg)  12/23/19 228 lb 3.2 oz (103.5 kg)    Physical Exam  General appearance - alert, well appearing, and in no distress Mental status - normal mood, behavior, speech, dress, motor activity, and thought processes Neck - supple, no significant adenopathy Chest - clear to auscultation, no wheezes, rales or rhonchi, symmetric air entry Heart -she has 2 out of 6 systolic ejection murmur left sternal border.  Regular rate and rhythm.  Pelvic -CMA Pollock present.  No external vaginal lesions.  She has some atrophy of the vaginal walls.  She has a small amount of dark brownish blood in the vaginal vault around the cervix.  Cervix appears grossly normal.  No cervical motion tenderness or adnexal masses.  Uterus did not feel enlarged.   Extremities -nonpitting lower extremity edema    CMP Latest Ref Rng & Units 08/14/2020 11/17/2019 05/15/2018  Glucose 65 - 99 mg/dL 126(H) 110(H) 109(H)  BUN 8 - 27 mg/dL 39(H) 25 40(H)  Creatinine 0.57 - 1.00 mg/dL 1.99(H) 2.08(H) 2.47(H)  Sodium 134 - 144 mmol/L 140 144 139  Potassium 3.5 - 5.2 mmol/L 4.6 4.3 4.3  Chloride 96 - 106 mmol/L 103 106 105  CO2 20 - 29 mmol/L '23 23 21  '$ Calcium 8.7 - 10.3 mg/dL 9.4 9.5 9.9  Total Protein 6.0 - 8.5 g/dL 7.5 7.1 6.9  Total Bilirubin 0.0 - 1.2 mg/dL 0.4 0.5 0.3  Alkaline Phos 44 - 121 IU/L 65 71 64  AST 0 - 40 IU/L '21 17 15  '$ ALT 0 - 32 IU/L '15 13 14   '$ Lipid Panel     Component Value Date/Time   CHOL  255 (H) 08/14/2020 1043   TRIG 59 08/14/2020 1043   HDL 94 08/14/2020 1043   CHOLHDL 2.7 08/14/2020 1043   CHOLHDL 2.2 Ratio 11/29/2010 2115   VLDL 13 11/29/2010 2115   LDLCALC 152 (H) 08/14/2020 1043   LDLDIRECT 108 (H) 05/07/2010 1841    CBC    Component Value Date/Time  WBC 3.8 11/17/2019 1211   WBC 4.1 01/06/2014 1802   RBC 4.30 11/17/2019 1211   RBC 3.83 (L) 01/06/2014 1802   HGB 12.5 11/17/2019 1211   HCT 38.2 11/17/2019 1211   PLT 183 11/17/2019 1211   MCV 89 11/17/2019 1211   MCH 29.1 11/17/2019 1211   MCH 30.0 01/06/2014 1802   MCHC 32.7 11/17/2019 1211   MCHC 33.6 01/06/2014 1802   RDW 13.2 11/17/2019 1211   LYMPHSABS 1.7 01/06/2014 1802   MONOABS 0.4 01/06/2014 1802   EOSABS 0.2 01/06/2014 1802   BASOSABS 0.0 01/06/2014 1802    ASSESSMENT AND PLAN: 1. Essential hypertension Repeat blood pressure today improved but not at goal.  Goal is 130/80 or lower.  She is on maximum dose of her medications.  She reports home blood pressure readings are better.  She will continue current medications and low-salt diet.  Advised to continue to monitor blood pressure at home and bring readings in on next visit.  2. Morbid obesity with BMI of 40.0-44.9, adult ([place]) Dietary counseling given.  Advised to eliminate sugary drinks from her diet including juices, sodas and sweet tea, cut back on portion sizes especially of white carbohydrates and eat more lean white meat instead of red meat.  Commended her on moving more and encouraged her to keep up the good work.  3. Mixed hyperlipidemia Continue atorvastatin.  LDL was not at goal on last visit but at that time she was not taking the atorvastatin.  4. Pap smear for cervical cancer screening - Cytology - PAP  5. History of abnormal cervical Pap smear - Cytology - PAP  6. Postmenopausal bleeding Advised patient that this bleeding is abnormal and she needs to see the gynecologist to be considered for endometrial biopsy.  Uterine  cancer needs to be ruled out.  Patient insisting that she thinks it is from drinking grape juice and I told her that it is not.  Advised to apply for the orange card/cone discount card so that her visit with the gynecologist would be covered. - Ambulatory referral to Gynecology  7. CKD (chronic kidney disease) stage 3, GFR 30-59 ml/min (HCC) Improved.  Previously she was CKD 4. - Basic Metabolic Panel  Addendum [id number]: Patient with hyperglycemia noted on lab results that were drawn yesterday.  I will add A1c to screen for diabetes.   Patient was given the opportunity to ask questions.  Patient verbalized understanding of the plan and was able to repeat key elements of the plan.   Orders Placed This Encounter  Procedures  . Basic Metabolic Panel  . Ambulatory referral to Gynecology     Requested Prescriptions    No prescriptions requested or ordered in this encounter    Return in about 4 months (around 04/13/2021).  Karle Plumber, MD, FACP

## 2020-12-13 ENCOUNTER — Telehealth: Payer: Self-pay

## 2020-12-13 LAB — BASIC METABOLIC PANEL
BUN/Creatinine Ratio: 13 (ref 12–28)
BUN: 29 mg/dL — ABNORMAL HIGH (ref 8–27)
CO2: 21 mmol/L (ref 20–29)
Calcium: 9.8 mg/dL (ref 8.7–10.3)
Chloride: 103 mmol/L (ref 96–106)
Creatinine, Ser: 2.23 mg/dL — ABNORMAL HIGH (ref 0.57–1.00)
Glucose: 138 mg/dL — ABNORMAL HIGH (ref 65–99)
Potassium: 4 mmol/L (ref 3.5–5.2)
Sodium: 143 mmol/L (ref 134–144)
eGFR: 24 mL/min/{1.73_m2} — ABNORMAL LOW (ref >59)

## 2020-12-13 NOTE — Telephone Encounter (Signed)
Contacted pt to go over lab results pt is aware and doesn't have any questions or concerns   Pt states she has not seen the kidney specialist and she doesn't have an appt schedule. Pt doesn't have any insurance   Per referral note "Pt don't have Insurance mailed a letter with the options to apply for [facility] card or go to [employer]   address [address] Ph. # [phone]. Pay $127.50 the day of the appointment and the rest make payments or going to [facility] $100 and apply for financial assistant  [phone] there address [place] [address] ph. # [phone] waiting for patient decision."

## 2020-12-13 NOTE — Progress Notes (Signed)
Let patient know that her kidney function continues to worsen.  Please inquire whether or not she has been able to get in with the kidney specialist at [facility] for follow-up.  I had submitted the referral when I saw her back in November.  Blood sugar level is elevated.  I asked the lab whether we would be able to add a level called an A1c to screen for diabetes, but it cannot be added to the blood that was drawn yesterday.  Please return to the lab at her convenience to have this done.

## 2020-12-13 NOTE — Addendum Note (Signed)
Addended by: Karle Plumber B on: 12/13/2020 10:10 AM   Modules accepted: Orders

## 2020-12-14 NOTE — Telephone Encounter (Signed)
Will have referral coordinator touch base with her regarding nephrology referral submitted 08/2020

## 2020-12-18 ENCOUNTER — Other Ambulatory Visit: Payer: Self-pay | Admitting: Internal Medicine

## 2020-12-18 DIAGNOSIS — I10 Essential (primary) hypertension: Secondary | ICD-10-CM

## 2020-12-18 LAB — CYTOLOGY - PAP
Comment: NEGATIVE
Diagnosis: UNDETERMINED — AB
High risk HPV: NEGATIVE

## 2020-12-19 ENCOUNTER — Telehealth: Payer: Self-pay

## 2020-12-19 NOTE — Telephone Encounter (Signed)
Contacted pt to go over pap results pt is aware and doesn't have any questions or concerns  

## 2020-12-19 NOTE — Telephone Encounter (Signed)
Gm  I Notify the patient to apply for orange card on 08/2020  and she was approved recently  .  I will sent the referral to  [facility] they will sent the referral to [facility] for review .

## 2021-01-06 ENCOUNTER — Other Ambulatory Visit: Payer: Self-pay

## 2021-01-10 ENCOUNTER — Encounter: Payer: Self-pay | Admitting: Obstetrics and Gynecology

## 2021-01-24 ENCOUNTER — Other Ambulatory Visit: Payer: Self-pay | Admitting: Internal Medicine

## 2021-01-24 ENCOUNTER — Other Ambulatory Visit: Payer: Self-pay

## 2021-01-24 DIAGNOSIS — N184 Chronic kidney disease, stage 4 (severe): Secondary | ICD-10-CM

## 2021-01-24 DIAGNOSIS — I10 Essential (primary) hypertension: Secondary | ICD-10-CM

## 2021-01-24 MED ORDER — AMLODIPINE BESYLATE 10 MG PO TABS
ORAL_TABLET | Freq: Every day | ORAL | 4 refills | Status: DC
  Filled 2021-01-24: qty 30, 30d supply, fill #0
  Filled 2021-03-02: qty 30, 30d supply, fill #1
  Filled 2021-03-29: qty 30, 30d supply, fill #2

## 2021-01-24 MED ORDER — FUROSEMIDE 40 MG PO TABS
ORAL_TABLET | Freq: Two times a day (BID) | ORAL | 4 refills | Status: DC
  Filled 2021-01-24: qty 60, 30d supply, fill #0
  Filled 2021-03-29: qty 60, 30d supply, fill #1
  Filled 2021-05-21: qty 60, 30d supply, fill #2

## 2021-01-24 MED FILL — Hydralazine HCl Tab 100 MG: ORAL | 30 days supply | Qty: 90 | Fill #0 | Status: AC

## 2021-01-24 MED FILL — Atorvastatin Calcium Tab 40 MG (Base Equivalent): ORAL | 30 days supply | Qty: 30 | Fill #0 | Status: AC

## 2021-01-24 MED FILL — Isosorbide Dinitrate Tab 20 MG: ORAL | 30 days supply | Qty: 90 | Fill #0 | Status: AC

## 2021-01-24 MED FILL — Labetalol HCl Tab 200 MG: ORAL | 30 days supply | Qty: 90 | Fill #0 | Status: AC

## 2021-01-25 ENCOUNTER — Other Ambulatory Visit: Payer: Self-pay

## 2021-03-02 ENCOUNTER — Other Ambulatory Visit: Payer: Self-pay | Admitting: Internal Medicine

## 2021-03-02 ENCOUNTER — Other Ambulatory Visit: Payer: Self-pay

## 2021-03-02 DIAGNOSIS — I10 Essential (primary) hypertension: Secondary | ICD-10-CM

## 2021-03-02 MED ORDER — LABETALOL HCL 200 MG PO TABS
ORAL_TABLET | ORAL | 1 refills | Status: DC
  Filled 2021-03-02: qty 90, 30d supply, fill #0
  Filled 2021-04-11: qty 90, 30d supply, fill #1

## 2021-03-02 MED FILL — Atorvastatin Calcium Tab 40 MG (Base Equivalent): ORAL | 30 days supply | Qty: 30 | Fill #1 | Status: AC

## 2021-03-02 MED FILL — Isosorbide Dinitrate Tab 20 MG: ORAL | 30 days supply | Qty: 90 | Fill #1 | Status: AC

## 2021-03-06 ENCOUNTER — Other Ambulatory Visit: Payer: Self-pay

## 2021-03-20 ENCOUNTER — Other Ambulatory Visit: Payer: Self-pay | Admitting: Internal Medicine

## 2021-03-20 ENCOUNTER — Ambulatory Visit: Payer: Self-pay | Attending: Internal Medicine

## 2021-03-20 ENCOUNTER — Other Ambulatory Visit: Payer: Self-pay

## 2021-03-21 ENCOUNTER — Telehealth: Payer: Self-pay

## 2021-03-21 LAB — HEMOGLOBIN A1C
Est. average glucose Bld gHb Est-mCnc: 140 mg/dL
Hgb A1c MFr Bld: 6.5 % — ABNORMAL HIGH (ref 4.8–5.6)

## 2021-03-21 NOTE — Progress Notes (Signed)
Let patient know that screening test for diabetes was positive.  She is not in the range where we need to start medication.  However healthy eating habits and regular exercise as discussed on previous visits would be helpful.  We will discuss further on her next visit.  Also let patient know that the letter that she received from our referral coordinator regarding nephrology options, she needs to let us know whether she would be able to cover the cost to see the nephrologist since she does not have insurance.  It is very important that she  she gets in with a kidney specialist as kidney function continues to decline.

## 2021-03-21 NOTE — Telephone Encounter (Signed)
Contacted pt to go over lab results pt didn't answer lvm  

## 2021-03-29 ENCOUNTER — Other Ambulatory Visit: Payer: Self-pay

## 2021-03-29 ENCOUNTER — Telehealth: Payer: Self-pay

## 2021-03-29 NOTE — Telephone Encounter (Signed)
Pt given lab results per notes of Dr. Wynetta Emery on 03/21/21. Pt verbalized understanding. She says she has not received the letter, she will think about the referral and call back to let Dr. Wynetta Emery know.     Maria Pier, MD  03/21/2021 10:25 AM EDT      Let patient know that screening test for diabetes was positive.  She is not in the range where we need to start medication.  However healthy eating habits and regular exercise as discussed on previous visits would be helpful.  We will discuss further on her next visit.  Also let patient know that the letter that she received from our referral coordinator regarding nephrology options, she needs to let us know whether she would be able to cover the cost to see the nephrologist since she does not have insurance.  It is very important that she she gets in with a kidney specialist as kidney function continues to decline.

## 2021-03-30 ENCOUNTER — Other Ambulatory Visit: Payer: Self-pay

## 2021-04-04 ENCOUNTER — Other Ambulatory Visit: Payer: Self-pay

## 2021-04-04 MED FILL — Atorvastatin Calcium Tab 40 MG (Base Equivalent): ORAL | 30 days supply | Qty: 30 | Fill #2 | Status: AC

## 2021-04-04 MED FILL — Atorvastatin Calcium Tab 40 MG (Base Equivalent): ORAL | 30 days supply | Qty: 30 | Fill #2 | Status: CN

## 2021-04-11 MED FILL — Hydralazine HCl Tab 100 MG: ORAL | 30 days supply | Qty: 90 | Fill #1 | Status: AC

## 2021-04-12 ENCOUNTER — Other Ambulatory Visit: Payer: Self-pay

## 2021-04-12 ENCOUNTER — Other Ambulatory Visit: Payer: Self-pay | Admitting: Internal Medicine

## 2021-04-12 DIAGNOSIS — I10 Essential (primary) hypertension: Secondary | ICD-10-CM

## 2021-04-12 NOTE — Telephone Encounter (Signed)
Medication: Rx #: [id number] amLODipine (NORVASC) 10 MG tablet [id number] , Rx #: [id number] isosorbide dinitrate (ISORDIL) 20 MG tablet [id number] , Rx #: [id number] hydrALAZINE (APRESOLINE) 100 MG tablet [id number] , labetalol (NORMODYNE) 200 MG tablet [id number]  ENDED   Has the patient contacted their pharmacy? YES  (Agent: If no, request that the patient contact the pharmacy for the refill.) (Agent: If yes, when and what did the pharmacy advise?)  Preferred Pharmacy (with phone number or street name): [address] [place] [address] Phone: [phone] Fax: [phone] Hours: M-F 8:30a-5:30p    Agent: Please be advised that RX refills may take up to 3 business days. We ask that you follow-up with your pharmacy.

## 2021-04-13 ENCOUNTER — Other Ambulatory Visit: Payer: Self-pay

## 2021-04-13 MED ORDER — ISOSORBIDE DINITRATE 20 MG PO TABS: ORAL_TABLET | ORAL | 0 refills | Status: DC

## 2021-04-13 MED ORDER — LABETALOL HCL 200 MG PO TABS: ORAL_TABLET | ORAL | 0 refills | Status: DC

## 2021-04-13 MED ORDER — AMLODIPINE BESYLATE 10 MG PO TABS: ORAL_TABLET | Freq: Every day | ORAL | 0 refills | Status: DC

## 2021-04-13 MED ORDER — HYDRALAZINE HCL 100 MG PO TABS: ORAL_TABLET | Freq: Three times a day (TID) | ORAL | 0 refills | Status: DC

## 2021-04-18 ENCOUNTER — Telehealth: Payer: Self-pay | Admitting: Internal Medicine

## 2021-04-18 NOTE — Telephone Encounter (Signed)
I return Pt call, NO VM unable to leave msg

## 2021-04-18 NOTE — Telephone Encounter (Signed)
Copied from [place] [id number]. Topic: General - Call Back - No Documentation >> Apr 18, 2021  1:14 PM Erick Blinks wrote: Reason for CRM: Pt is requesting a call back from [facility] contact: [phone]

## 2021-04-19 ENCOUNTER — Emergency Department (HOSPITAL_COMMUNITY)
Admission: EM | Admit: 2021-04-19 | Discharge: 2021-04-20 | Disposition: A | Discharge status: Home / Self Care | Payer: Self-pay | Attending: Emergency Medicine | Admitting: Emergency Medicine

## 2021-04-19 DIAGNOSIS — I10 Essential (primary) hypertension: Secondary | ICD-10-CM

## 2021-04-19 DIAGNOSIS — Z79899 Other long term (current) drug therapy: Secondary | ICD-10-CM | POA: Insufficient documentation

## 2021-04-19 DIAGNOSIS — Z760 Encounter for issue of repeat prescription: Secondary | ICD-10-CM | POA: Insufficient documentation

## 2021-04-19 DIAGNOSIS — I129 Hypertensive chronic kidney disease with stage 1 through stage 4 chronic kidney disease, or unspecified chronic kidney disease: Secondary | ICD-10-CM | POA: Insufficient documentation

## 2021-04-19 DIAGNOSIS — N184 Chronic kidney disease, stage 4 (severe): Secondary | ICD-10-CM | POA: Insufficient documentation

## 2021-04-19 LAB — CBC
HCT: 36.2 % (ref 36.0–46.0)
Hemoglobin: 11.9 g/dL — ABNORMAL LOW (ref 12.0–15.0)
MCH: 29.8 pg (ref 26.0–34.0)
MCHC: 32.9 g/dL (ref 30.0–36.0)
MCV: 90.5 fL (ref 80.0–100.0)
Platelets: 189 10*3/uL (ref 150–400)
RBC: 4 MIL/uL (ref 3.87–5.11)
RDW: 13.4 % (ref 11.5–15.5)
WBC: 5.9 10*3/uL (ref 4.0–10.5)
nRBC: 0 % (ref 0.0–0.2)

## 2021-04-19 LAB — BASIC METABOLIC PANEL
Anion gap: 8 (ref 5–15)
BUN: 36 mg/dL — ABNORMAL HIGH (ref 8–23)
CO2: 24 mmol/L (ref 22–32)
Calcium: 9.4 mg/dL (ref 8.9–10.3)
Chloride: 106 mmol/L (ref 98–111)
Creatinine, Ser: 2.6 mg/dL — ABNORMAL HIGH (ref 0.44–1.00)
GFR, Estimated: 20 mL/min — ABNORMAL LOW (ref >60)
Glucose, Bld: 141 mg/dL — ABNORMAL HIGH (ref 70–99)
Potassium: 4 mmol/L (ref 3.5–5.1)
Sodium: 138 mmol/L (ref 135–145)

## 2021-04-19 LAB — TROPONIN I (HIGH SENSITIVITY): Troponin I (High Sensitivity): 16 ng/L (ref <18)

## 2021-04-19 NOTE — ED Triage Notes (Signed)
Pt has not taken prescribed '20mg'$  isosorbide in approx 4 days, wants refill. Denies CP/related issues, states she just wants refill.

## 2021-04-20 ENCOUNTER — Other Ambulatory Visit: Payer: Self-pay

## 2021-04-20 LAB — TROPONIN I (HIGH SENSITIVITY): Troponin I (High Sensitivity): 18 ng/L — ABNORMAL HIGH (ref <18)

## 2021-04-20 MED ORDER — ISOSORBIDE DINITRATE 20 MG PO TABS
20.0000 mg | ORAL_TABLET | Freq: Three times a day (TID) | ORAL | 0 refills | Status: DC
  Filled 2021-04-20: qty 21, 7d supply, fill #0

## 2021-04-20 NOTE — ED Provider Notes (Signed)
[facility] EMERGENCY DEPARTMENT Provider Note   CSN: [id number] Arrival date & time: 04/19/21  2104     History Chief Complaint  Patient presents with   Medication Refill    [name] is a 65 y.o. female.  The history is provided by the patient.  Medication Refill    Patient with history of hypertension and renal insufficiency presents with medication refill.  Patient reports she needs her Isordil refilled. She has no complaints.  No chest pain or shortness of breath. No new lower extremity edema Past Medical History:  Diagnosis Date   Hypertension     Patient Active Problem List   Diagnosis Date Noted   Postmenopausal bleeding 12/12/2020   History of abnormal cervical Pap smear 12/12/2020   CKD (chronic kidney disease) stage 4, GFR 15-29 ml/min (HCC) 03/23/2020   LGSIL on Pap smear of cervix 12/29/2019   CKD (chronic kidney disease) stage 3, GFR 30-59 ml/min (HCC) 08/14/2018   Essential hypertension 05/15/2018   Reactive depression 05/15/2018   History of CVA (cerebrovascular accident) without residual deficits 05/15/2018    Past Surgical History:  Procedure Laterality Date   CESAREAN SECTION       OB History   No obstetric history on file.     Family History  Problem Relation Age of Onset   Stroke Mother     Social History   Tobacco Use   Smoking status: Never   Smokeless tobacco: Never  Vaping Use   Vaping Use: Never used  Substance Use Topics   Alcohol use: No    Home Medications Prior to Admission medications   Medication Sig Start Date End Date Taking? Authorizing Provider  isosorbide dinitrate (ISORDIL) 20 MG tablet Take 1 tablet (20 mg total) by mouth 3 (three) times daily. 04/20/21  Yes Ripley Fraise, MD  amLODipine (NORVASC) 10 MG tablet TAKE 1 TABLET (10 MG TOTAL) BY MOUTH DAILY. 04/13/21 04/13/22  Ladell Pier, MD  atorvastatin (LIPITOR) 40 MG tablet TAKE 1 TABLET (40 MG TOTAL) BY MOUTH DAILY. 08/14/20  08/14/21  Ladell Pier, MD  furosemide (LASIX) 40 MG tablet TAKE 1 TABLET (40 MG TOTAL) BY MOUTH 2 (TWO) TIMES DAILY. 01/24/21 01/24/22  Ladell Pier, MD  hydrALAZINE (APRESOLINE) 100 MG tablet TAKE 1 TABLET (100 MG TOTAL) BY MOUTH 3 (THREE) TIMES DAILY. 08/14/20 08/14/21  Ladell Pier, MD  hydrALAZINE (APRESOLINE) 100 MG tablet TAKE 1 TABLET (100 MG TOTAL) BY MOUTH 3 (THREE) TIMES DAILY. 04/13/21 04/13/22  Ladell Pier, MD  labetalol (NORMODYNE) 200 MG tablet TAKE 1 TABLET (200 MG TOTAL) BY MOUTH 3 (THREE) TIMES DAILY. 03/02/21 03/02/22  Ladell Pier, MD  labetalol (NORMODYNE) 200 MG tablet TAKE 1 TABLET (200 MG TOTAL) BY MOUTH 3 (THREE) TIMES DAILY. 04/13/21 04/13/22  Ladell Pier, MD    Allergies    Patient has no known allergies.  Review of Systems   Review of Systems  Respiratory:  Negative for shortness of breath.   Cardiovascular:  Negative for chest pain and leg swelling.   Physical Exam Updated Vital Signs BP (!) 152/90   Pulse 65   Temp 98.2 F (36.8 C) (Oral)   Resp 18   SpO2 98%   Physical Exam CONSTITUTIONAL: Well developed/well nourished HEAD: Normocephalic/atraumatic EYES: EOMI ENMT: Mucous membranes moist NECK: supple no meningeal signs SPINE/BACK:entire spine nontender CV: S1/S2 noted LUNGS: Lungs are clear to auscultation bilaterally, no apparent distress ABDOMEN: soft NEURO: Pt is awake/alert/appropriate, moves  all extremitiesx4.  No facial droop.   EXTREMITIES: full ROM minimal symmetric edema to bilateral lower Extremities SKIN: warm, color normal PSYCH: no abnormalities of mood noted, alert and oriented to situation  ED Results / Procedures / Treatments   Labs (all labs ordered are listed, but only abnormal results are displayed) Labs Reviewed  BASIC METABOLIC PANEL - Abnormal; Notable for the following components:      Result Value   Glucose, Bld 141 (*)    BUN 36 (*)    Creatinine, Ser 2.60 (*)    GFR, Estimated 20 (*)     All other components within normal limits  CBC - Abnormal; Notable for the following components:   Hemoglobin 11.9 (*)    All other components within normal limits  TROPONIN I (HIGH SENSITIVITY) - Abnormal; Notable for the following components:   Troponin I (High Sensitivity) 18 (*)    All other components within normal limits  TROPONIN I (HIGH SENSITIVITY)    EKG EKG Interpretation  Date/Time:  Thursday April 19 2021 22:19:53 EDT Ventricular Rate:  61 PR Interval:  180 QRS Duration: 128 QT Interval:  460 QTC Calculation: 463 R Axis:   -42 Text Interpretation: Normal sinus rhythm with sinus arrhythmia Possible Left atrial enlargement Left axis deviation Left ventricular hypertrophy with QRS widening ( Cornell product ) Possible Lateral infarct , age undetermined Abnormal ECG Confirmed by Ripley Fraise [phone]) on 04/20/2021 1:13:26 AM  Radiology No results found.  Procedures Procedures   Medications Ordered in ED Medications - No data to display  ED Course  I have reviewed the triage vital signs and the nursing notes.  Pertinent labs  results that were available during my care of the patient were reviewed by me and considered in my medical decision making (see chart for details).    MDM Rules/Calculators/A&P                          Patient here for med refill only Denies chest pain shortness of breath.  Patient has known renal insufficiency.  1 week supply of Isordil given the patient, advised to follow with PCP next week, she will likely need repeat labs. Final Clinical Impression(s) / ED Diagnoses Final diagnoses:  Primary hypertension  Medication refill    Rx / DC Orders ED Discharge Orders          Ordered    isosorbide dinitrate (ISORDIL) 20 MG tablet  3 times daily        04/20/21 0120             Ripley Fraise, MD 04/20/21 0148

## 2021-04-23 ENCOUNTER — Other Ambulatory Visit: Payer: Self-pay

## 2021-04-23 MED ORDER — LABETALOL HCL 200 MG PO TABS
ORAL_TABLET | ORAL | 3 refills | Status: DC
  Filled 2021-04-23 – 2021-05-21 (×2): qty 90, 30d supply, fill #0
  Filled 2021-07-06: qty 90, 30d supply, fill #1
  Filled 2021-08-14: qty 90, 30d supply, fill #2
  Filled 2021-09-23: qty 90, 30d supply, fill #3
  Filled 2021-10-31: qty 90, 30d supply, fill #4
  Filled 2021-10-31: qty 90, 30d supply, fill #0
  Filled 2021-12-07: qty 90, 30d supply, fill #1
  Filled 2022-01-28 – 2022-01-31 (×3): qty 90, 30d supply, fill #2
  Filled 2022-03-11: qty 90, 30d supply, fill #3
  Filled 2022-04-18: qty 90, 30d supply, fill #4

## 2021-04-23 MED ORDER — ISOSORBIDE DINITRATE 20 MG PO TABS
ORAL_TABLET | ORAL | 3 refills | Status: DC
  Filled 2021-04-23: qty 90, 30d supply, fill #0
  Filled 2021-06-05: qty 90, 30d supply, fill #1
  Filled 2021-07-20: qty 90, 30d supply, fill #2
  Filled 2021-08-23: qty 90, 30d supply, fill #3

## 2021-04-23 MED ORDER — AMLODIPINE BESYLATE 10 MG PO TABS
ORAL_TABLET | ORAL | 3 refills | Status: DC
  Filled 2021-04-23: qty 15, 30d supply, fill #0
  Filled 2021-05-31: qty 15, 30d supply, fill #1

## 2021-04-23 MED ORDER — ATORVASTATIN CALCIUM 40 MG PO TABS
40.0000 mg | ORAL_TABLET | Freq: Every day | ORAL | 1 refills | Status: DC
  Filled 2021-04-23 – 2021-05-21 (×2): qty 30, 30d supply, fill #0
  Filled 2021-07-06: qty 30, 30d supply, fill #1
  Filled 2021-08-14: qty 30, 30d supply, fill #2
  Filled 2021-09-23: qty 30, 30d supply, fill #3
  Filled 2021-10-31: qty 30, 30d supply, fill #0
  Filled 2021-10-31: qty 30, 30d supply, fill #4
  Filled 2021-12-07: qty 30, 30d supply, fill #1

## 2021-04-23 MED ORDER — LISINOPRIL 5 MG PO TABS
ORAL_TABLET | ORAL | 3 refills | Status: DC
  Filled 2021-04-23: qty 30, 30d supply, fill #0
  Filled 2021-05-31: qty 30, 30d supply, fill #1
  Filled 2021-07-06: qty 30, 30d supply, fill #2
  Filled 2021-08-14: qty 30, 30d supply, fill #3

## 2021-04-23 MED ORDER — HYDRALAZINE HCL 100 MG PO TABS
ORAL_TABLET | ORAL | 3 refills | Status: DC
  Filled 2021-04-23 – 2021-05-21 (×2): qty 90, 30d supply, fill #0
  Filled 2021-07-06: qty 90, 30d supply, fill #1
  Filled 2021-08-23: qty 90, 30d supply, fill #2
  Filled 2021-09-23: qty 90, 30d supply, fill #3
  Filled 2021-10-31: qty 90, 30d supply, fill #0
  Filled 2021-10-31: qty 90, 30d supply, fill #4
  Filled 2021-12-07: qty 90, 30d supply, fill #1
  Filled 2022-01-28: qty 90, 30d supply, fill #2
  Filled 2022-03-08: qty 90, 30d supply, fill #3
  Filled 2022-04-18: qty 90, 30d supply, fill #4

## 2021-04-26 ENCOUNTER — Other Ambulatory Visit: Payer: Self-pay

## 2021-05-16 ENCOUNTER — Other Ambulatory Visit: Payer: Self-pay

## 2021-05-21 ENCOUNTER — Other Ambulatory Visit: Payer: Self-pay

## 2021-05-23 ENCOUNTER — Other Ambulatory Visit: Payer: Self-pay

## 2021-05-31 ENCOUNTER — Other Ambulatory Visit: Payer: Self-pay

## 2021-06-01 ENCOUNTER — Other Ambulatory Visit: Payer: Self-pay

## 2021-06-05 ENCOUNTER — Other Ambulatory Visit: Payer: Self-pay

## 2021-06-05 ENCOUNTER — Ambulatory Visit: Payer: Self-pay

## 2021-06-19 ENCOUNTER — Ambulatory Visit: Payer: Self-pay | Attending: Internal Medicine | Admitting: Internal Medicine

## 2021-06-19 ENCOUNTER — Other Ambulatory Visit: Payer: Self-pay

## 2021-06-19 VITALS — BP 150/90 | HR 76 | Resp 16 | Ht 60.0 in | Wt 213.8 lb

## 2021-06-19 DIAGNOSIS — N950 Postmenopausal bleeding: Secondary | ICD-10-CM

## 2021-06-19 DIAGNOSIS — U071 COVID-19: Secondary | ICD-10-CM

## 2021-06-19 DIAGNOSIS — Z23 Encounter for immunization: Secondary | ICD-10-CM

## 2021-06-19 DIAGNOSIS — I129 Hypertensive chronic kidney disease with stage 1 through stage 4 chronic kidney disease, or unspecified chronic kidney disease: Secondary | ICD-10-CM

## 2021-06-19 DIAGNOSIS — N184 Chronic kidney disease, stage 4 (severe): Secondary | ICD-10-CM

## 2021-06-19 DIAGNOSIS — E1169 Type 2 diabetes mellitus with other specified complication: Secondary | ICD-10-CM

## 2021-06-19 MED ORDER — FUROSEMIDE 40 MG PO TABS
ORAL_TABLET | Freq: Two times a day (BID) | ORAL | 4 refills | Status: DC
  Filled 2021-06-19: qty 60, 30d supply, fill #0
  Filled 2021-08-14: qty 60, 30d supply, fill #1
  Filled 2021-09-23: qty 60, 30d supply, fill #2
  Filled 2021-10-31: qty 60, 30d supply, fill #0
  Filled 2021-10-31: qty 60, 30d supply, fill #3
  Filled 2021-12-07: qty 60, 30d supply, fill #1
  Filled 2022-03-08: qty 60, 30d supply, fill #2
  Filled 2022-04-10: qty 60, 30d supply, fill #3
  Filled 2022-05-29: qty 60, 30d supply, fill #4

## 2021-06-19 MED ORDER — AMLODIPINE BESYLATE 5 MG PO TABS
7.5000 mg | ORAL_TABLET | Freq: Every day | ORAL | 3 refills | Status: DC
  Filled 2021-06-19: qty 45, 30d supply, fill #0
  Filled 2021-07-20: qty 45, 30d supply, fill #1
  Filled 2021-08-23: qty 45, 30d supply, fill #2
  Filled 2021-09-23: qty 45, 30d supply, fill #3
  Filled 2021-10-31: qty 45, 30d supply, fill #4
  Filled 2021-10-31: qty 45, 30d supply, fill #0
  Filled 2021-12-07: qty 45, 30d supply, fill #1
  Filled 2022-01-14: qty 45, 30d supply, fill #2
  Filled 2022-02-22 – 2022-03-08 (×2): qty 45, 30d supply, fill #3

## 2021-06-19 NOTE — Progress Notes (Signed)
Patient ID: SEYMONE BLEW, female    DOB: Feb 02, 1956  MRN: [id number]  CC: Hypertension   Subjective: [name] is a 65 y.o. female who presents for chronic ds management. Her concerns today include:  Patient with history of HTN, HL, CVA 2009, CKD 3-4, obesity.   Pt reports having some difficulty getting RF on Amlodipine in July.  Pt states she called and spoke with a nurse but did not get call back.  Ended up being seen in ER for RF.  Shortly after ER visit, she developed COVID symptoms and tested positive. Finally feels back to normal this wk.  CKD 3-4: saw kidney spec 04/23/21 at [facility].  CKD 4 thought to be due to prolong uncontrolled BP.  Told to cut Norvasc 10 mg to 1/2 tab daily.  Lisinopril 5 mg added.    -checks BP occasional.  Last checked 2 days ago.  It was 138/75.  No CP or LE edema  HL:  compliant with Lipitor.  Postmenopausal bleeding/spotting: referred to GYN on last visit.  She has not received appt as yet.  She reports spotting when she drinks lemon juice (in tea) or grape fruit juice.  She states that this always happens whenever she drinks anything that has lemon in it and does not feel that there is anything wrong.  Obesity/DM:  A1C was 6.5 on labs done in June.  She was called by the nurse and informed of this lab result.  However today she tells me she does not recall the conversation.  Please refer to RN note dated 03/29/2021. She was exercising prior to getting COVID in July.  Has not exercise since then.  Plans to get back into walking.  She feels she is doing okay with her eating habits.  She eats small portions and drinks mainly water.  HM: due for flu shot and COVID booster. Patient Active Problem List   Diagnosis Date Noted   Postmenopausal bleeding 12/12/2020   History of abnormal cervical Pap smear 12/12/2020   CKD (chronic kidney disease) stage 4, GFR 15-29 ml/min (HCC) 03/23/2020   LGSIL on Pap smear of cervix 12/29/2019   CKD (chronic kidney  disease) stage 3, GFR 30-59 ml/min (HCC) 08/14/2018   Essential hypertension 05/15/2018   Reactive depression 05/15/2018   History of CVA (cerebrovascular accident) without residual deficits 05/15/2018     Current Outpatient Medications on File Prior to Visit  Medication Sig Dispense Refill   atorvastatin (LIPITOR) 40 MG tablet Take 1 tablet (40 mg total) by mouth daily. 90 tablet 1   hydrALAZINE (APRESOLINE) 100 MG tablet Take 1 tablet (100 mg total) by mouth 3 times daily. 270 tablet 3   isosorbide dinitrate (ISORDIL) 20 MG tablet Take 1 tablet (20 mg total) by mouth 3 times daily. 90 tablet 3   labetalol (NORMODYNE) 200 MG tablet Take 1 tablet (200 mg total) by mouth 3 times daily. 270 tablet 3   lisinopril (ZESTRIL) 5 MG tablet Take 1 tablet (5 mg total) by mouth nightly. 30 tablet 3   No current facility-administered medications on file prior to visit.    No Known Allergies  Social History   Socioeconomic History   Marital status: Married    Spouse name: Not on file   Number of children: Not on file   Years of education: Not on file   Highest education level: Not on file  Occupational History   Occupation: retired Therapist, sports  Tobacco Use   Smoking status: Never  Smokeless tobacco: Never  Vaping Use   Vaping Use: Never used  Substance and Sexual Activity   Alcohol use: No   Drug use: Not on file   Sexual activity: Not Currently  Other Topics Concern   Not on file  Social History Narrative   Not on file   Social Determinants of Health   Financial Resource Strain: Not on file  Food Insecurity: Not on file  Transportation Needs: Not on file  Physical Activity: Not on file  Stress: Not on file  Social Connections: Not on file  Intimate Partner Violence: Not on file    Family History  Problem Relation Age of Onset   Stroke Mother     Past Surgical History:  Procedure Laterality Date   CESAREAN SECTION      ROS: Review of Systems Negative except as stated  above  PHYSICAL EXAM: BP (!) 150/90   Pulse 76   Resp 16   Ht 5' (1.524 m)   Wt 213 lb 12.8 oz (97 kg)   SpO2 96%   BMI 41.75 kg/m   Wt Readings from Last 3 Encounters:  06/19/21 213 lb 12.8 oz (97 kg)  12/12/20 234 lb 6.4 oz (106.3 kg)  08/14/20 239 lb (108.4 kg)    Physical Exam  General appearance - alert, well appearing, older female and in no distress Mental status - normal mood, behavior, speech, dress, motor activity, and thought processes Neck - supple, no significant adenopathy Chest - clear to auscultation, no wheezes, rales or rhonchi, symmetric air entry Heart - normal rate, regular rhythm, normal S1, S2, no murmurs, rubs, clicks or gallops Extremities -no lower extremity edema.   CMP Latest Ref Rng & Units 04/19/2021 12/12/2020 08/14/2020  Glucose 70 - 99 mg/dL 141(H) 138(H) 126(H)  BUN 8 - 23 mg/dL 36(H) 29(H) 39(H)  Creatinine 0.44 - 1.00 mg/dL 2.60(H) 2.23(H) 1.99(H)  Sodium 135 - 145 mmol/L 138 143 140  Potassium 3.5 - 5.1 mmol/L 4.0 4.0 4.6  Chloride 98 - 111 mmol/L 106 103 103  CO2 22 - 32 mmol/L '24 21 23  '$ Calcium 8.9 - 10.3 mg/dL 9.4 9.8 9.4  Total Protein 6.0 - 8.5 g/dL - - 7.5  Total Bilirubin 0.0 - 1.2 mg/dL - - 0.4  Alkaline Phos 44 - 121 IU/L - - 65  AST 0 - 40 IU/L - - 21  ALT 0 - 32 IU/L - - 15   Lipid Panel     Component Value Date/Time   CHOL 255 (H) 08/14/2020 1043   TRIG 59 08/14/2020 1043   HDL 94 08/14/2020 1043   CHOLHDL 2.7 08/14/2020 1043   CHOLHDL 2.2 Ratio 11/29/2010 2115   VLDL 13 11/29/2010 2115   LDLCALC 152 (H) 08/14/2020 1043   LDLDIRECT 108 (H) 05/07/2010 1841    CBC    Component Value Date/Time   WBC 5.9 04/19/2021 2214   RBC 4.00 04/19/2021 2214   HGB 11.9 (L) 04/19/2021 2214   HGB 12.5 11/17/2019 1211   HCT 36.2 04/19/2021 2214   HCT 38.2 11/17/2019 1211   PLT 189 04/19/2021 2214   PLT 183 11/17/2019 1211   MCV 90.5 04/19/2021 2214   MCV 89 11/17/2019 1211   MCH 29.8 04/19/2021 2214   MCHC 32.9 04/19/2021  2214   RDW 13.4 04/19/2021 2214   RDW 13.2 11/17/2019 1211   LYMPHSABS 1.7 01/06/2014 1802   MONOABS 0.4 01/06/2014 1802   EOSABS 0.2 01/06/2014 1802   BASOSABS 0.0 01/06/2014 1802  ASSESSMENT AND PLAN: 1. Hypertensive kidney disease with stage 4 chronic kidney disease (HCC) Improved but not at goal. Recommend increase amlodipine to 7.5 mg daily. She has 70-monthsupply on all of her prescriptions.  I recommend if she is able to afford it to get the 347-monthupply at a time that way she is not having to come to the pharmacy every month for refills. Encouraged to continue to monitor blood pressure at home with goal being 130/80 or lower. - furosemide (LASIX) 40 MG tablet; TAKE 1 TABLET (40 MG TOTAL) BY MOUTH 2 (TWO) TIMES DAILY.  Dispense: 180 tablet; Refill: 4 - amLODipine (NORVASC) 5 MG tablet; Take 1.5 tablets (7.5 mg total) by mouth daily.  Dispense: 135 tablet; Refill: 3  2. Type 2 diabetes mellitus with morbid obesity (HCBrewsterDiscussed diagnosis with the patient.  A1c is not at the level yet that we would need to put her on medication.  However I strongly encourage weight loss through change in eating habits and trying to move as much as she can.  Discussed complications of diabetes.  Recommend yearly eye exams.  Printed information given on the standard of care for diabetics. -She declines prescription for glucometer/testing supplies.  3. Postmenopausal bleeding Again I advised patient that any vaginal spotting at her age is considered abnormal.  I recommend that she fill out the paperwork and try to get approved for the orange card/cone discount so that she can see the gynecologist.  Message sent to our referral coordinator about getting her an appointment with a gynecologist.  I also recommend pelvic ultrasound.  Patient wants to wait until she has been approved for the orange card for that.  4. Need for immunization against influenza - Flu Vaccine QUAD 57m13mo (Fluarix, Fluzone &  Alfiuria Quad PF)  5. COVID-19 virus infection Patient feels that she is about back to her baseline.  Encouraged her to get her COVID booster shot within 3 months.   Patient was given the opportunity to ask questions.  Patient verbalized understanding of the plan and was able to repeat key elements of the plan.   Orders Placed This Encounter  Procedures   Flu Vaccine QUAD 57mo57mo(Fluarix, Fluzone & Alfiuria Quad PF)     Requested Prescriptions   Signed Prescriptions Disp Refills   furosemide (LASIX) 40 MG tablet 180 tablet 4    Sig: TAKE 1 TABLET (40 MG TOTAL) BY MOUTH 2 (TWO) TIMES DAILY.   amLODipine (NORVASC) 5 MG tablet 135 tablet 3    Sig: Take 1.5 tablets (7.5 mg total) by mouth daily.    Return in about 4 months (around 10/19/2021).  DeboKarle Plumber, FACP

## 2021-06-19 NOTE — Patient Instructions (Addendum)
Your blood pressure is not at goal.  Increase amlodipine to 5 mg 1-1/2 tablets daily which would be equal to 7.5 mg daily.  Please asked the front desk for the application for the orange card/cone discount card.  Take it home and fill it out.  There are some supporting documents that she would have to put with the form.  Once approved please let me know so that we can order the pelvic ultrasound as discussed today.  Diabetes Mellitus and Standards of [place] with and managing diabetes (diabetes mellitus) can be complicated. Your diabetes treatment may be managed by a team of health care providers, including: A physician who specializes in diabetes (endocrinologist). You might also have visits with a nurse practitioner or physician assistant. Nurses. A registered dietitian. A certified diabetes care and education specialist. An exercise specialist. A pharmacist. An eye doctor. A foot specialist (podiatrist). A dental care provider. A primary care provider. A mental health care provider. How to manage your diabetes You can do many things to successfully manage your diabetes. Your health care providers will follow guidelines to help you get the best quality of care. Here are general guidelines for your diabetes management plan. Your health care providers may give you more specific instructions. Physical exams When you are diagnosed with diabetes, and each year after that, your health care provider will ask about your medical and family history. You will have a physical exam, which may include: Measuring your height, weight, and body mass index (BMI). Checking your blood pressure. This will be done at every routine medical visit. Your target blood pressure may vary depending on your medical conditions, your age, and other factors. A thyroid exam. A skin exam. Screening for nerve damage (peripheral neuropathy). This may include checking the pulse in your legs and feet and the level of  sensation in your hands and feet. A foot exam to inspect the structure and skin of your feet, including checking for cuts, bruises, redness, blisters, sores, or other problems. Screening for blood vessel (vascular) problems. This may include checking the pulse in your legs and feet and checking your temperature. Blood tests Depending on your treatment plan and your personal needs, you may have the following tests: Hemoglobin A1C (HbA1C). This test provides information about blood sugar (glucose) control over the previous 2-3 months. It is used to adjust your treatment plan, if needed. This test will be done: At least 2 times a year, if you are meeting your treatment goals. 4 times a year, if you are not meeting your treatment goals or if your goals have changed. Lipid testing, including total cholesterol, LDL and HDL cholesterol, and triglyceride levels. The goal for LDL is less than 100 mg/dL (5.5 mmol/L). If you are at high risk for complications, the goal is less than 70 mg/dL (3.9 mmol/L). The goal for HDL is 40 mg/dL (2.2 mmol/L) or higher for men, and 50 mg/dL (2.8 mmol/L) or higher for women. An HDL cholesterol of 60 mg/dL (3.3 mmol/L) or higher gives some protection against heart disease. The goal for triglycerides is less than 150 mg/dL (8.3 mmol/L). Liver function tests. Kidney function tests. Thyroid function tests.  Dental and eye exams  Visit your dentist two times a year. If you have type 1 diabetes, your health care provider may recommend an eye exam within 5 years after you are diagnosed, and then once a year after your first exam. For children with type 1 diabetes, the health care provider may  recommend an eye exam when your child is age 7 or older and has had diabetes for 3-5 years. After the first exam, your child should get an eye exam once a year. If you have type 2 diabetes, your health care provider may recommend an eye exam as soon as you are diagnosed, and then every  1-2 years after your first exam. Immunizations A yearly flu (influenza) vaccine is recommended annually for everyone 6 months or older. This is especially important if you have diabetes. The pneumonia (pneumococcal) vaccine is recommended for everyone 2 years or older who has diabetes. If you are age 93 or older, you may get the pneumonia vaccine as a series of two separate shots. The hepatitis B vaccine is recommended for adults shortly after being diagnosed with diabetes. Adults and children with diabetes should receive all other vaccines according to age-specific recommendations from the Centers for Disease Control and Prevention (CDC). Mental and emotional health Screening for symptoms of eating disorders, anxiety, and depression is recommended at the time of diagnosis and after as needed. If your screening shows that you have symptoms, you may need more evaluation. You may work with a mental health care provider. Follow these instructions at home: Treatment plan You will monitor your blood glucose levels and may give yourself insulin. Your treatment plan will be reviewed at every medical visit. You and your health care provider will discuss: How you are taking your medicines, including insulin. Any side effects you have. Your blood glucose level target goals. How often you monitor your blood glucose level. Lifestyle habits, such as activity level and tobacco, alcohol, and substance use. Education Your health care provider will assess how well you are monitoring your blood glucose levels and whether you are taking your insulin and medicines correctly. He or she may refer you to: A certified diabetes care and education specialist to manage your diabetes throughout your life, starting at diagnosis. A registered dietitian who can create and review your personal nutrition plan. An exercise specialist who can discuss your activity level and exercise plan. General instructions Take  over-the-counter and prescription medicines only as told by your health care provider. Keep all follow-up visits. This is important. Where to find support There are many diabetes support networks, including: American Diabetes Association (ADA): diabetes.org Defeat Diabetes Foundation: defeatdiabetes.org Where to find more information American Diabetes Association (ADA): www.diabetes.org Association of Diabetes Care & Education Specialists (ADCES): diabeteseducator.org International Diabetes Federation (IDF): https://www.munoz-bell.org/ Summary Managing diabetes (diabetes mellitus) can be complicated. Your diabetes treatment may be managed by a team of health care providers. Your health care providers follow guidelines to help you get the best quality care. You should have physical exams, blood tests, blood pressure monitoring, immunizations, and screening tests regularly. Stay updated on how to manage your diabetes. Your health care providers may also give you more specific instructions based on your individual health. This information is not intended to replace advice given to you by your health care provider. Make sure you discuss any questions you have with your health care provider. Document Revised: 03/30/2020 Document Reviewed: 03/30/2020 Elsevier Patient Education  [place].   Influenza Virus Vaccine injection (Fluarix) What is this medication? INFLUENZA VIRUS VACCINE (in floo EN zuh VAHY ruhs vak SEEN) helps to reduce the risk of getting influenza also known as the flu. This medicine may be used for other purposes; ask your health care provider or pharmacist if you have questions. COMMON BRAND NAME(S): Fluarix, Fluzone What should I  tell my care team before I take this medication? They need to know if you have any of these conditions: bleeding disorder like hemophilia fever or infection Guillain-Barre syndrome or other neurological problems immune system problems infection with the human  immunodeficiency virus (HIV) or AIDS low blood platelet counts multiple sclerosis an unusual or allergic reaction to influenza virus vaccine, eggs, chicken proteins, latex, gentamicin, other medicines, foods, dyes or preservatives pregnant or trying to get pregnant breast-feeding How should I use this medication? This vaccine is for injection into a muscle. It is given by a health care professional. A copy of Vaccine Information Statements will be given before each vaccination. Read this sheet carefully each time. The sheet may change frequently. Talk to your pediatrician regarding the use of this medicine in children. Special care may be needed. Overdosage: If you think you have taken too much of this medicine contact a poison control center or emergency room at once. NOTE: This medicine is only for you. Do not share this medicine with others. What if I miss a dose? This does not apply. What may interact with this medication? chemotherapy or radiation therapy medicines that lower your immune system like etanercept, anakinra, infliximab, and adalimumab medicines that treat or prevent blood clots like warfarin phenytoin steroid medicines like prednisone or cortisone theophylline vaccines This list may not describe all possible interactions. Give your health care provider a list of all the medicines, herbs, non-prescription drugs, or dietary supplements you use. Also tell them if you smoke, drink alcohol, or use illegal drugs. Some items may interact with your medicine. What should I watch for while using this medication? Report any side effects that do not go away within 3 days to your doctor or health care professional. Call your health care provider if any unusual symptoms occur within 6 weeks of receiving this vaccine. You may still catch the flu, but the illness is not usually as bad. You cannot get the flu from the vaccine. The vaccine will not protect against colds or other illnesses  that may cause fever. The vaccine is needed every year. What side effects may I notice from receiving this medication? Side effects that you should report to your doctor or health care professional as soon as possible: allergic reactions like skin rash, itching or hives, swelling of the face, lips, or tongue Side effects that usually do not require medical attention (report to your doctor or health care professional if they continue or are bothersome): fever headache muscle aches and pains pain, tenderness, redness, or swelling at site where injected weak or tired This list may not describe all possible side effects. Call your doctor for medical advice about side effects. You may report side effects to FDA at 1-800-FDA-1088. Where should I keep my medication? This vaccine is only given in a clinic, pharmacy, doctor's office, or other health care setting and will not be stored at home. NOTE: This sheet is a summary. It may not cover all possible information. If you have questions about this medicine, talk to your doctor, pharmacist, or health care provider.  2022 Elsevier/Gold Standard (2008-04-20 09:30:40)

## 2021-07-06 ENCOUNTER — Telehealth: Payer: Self-pay | Admitting: Internal Medicine

## 2021-07-06 DIAGNOSIS — N950 Postmenopausal bleeding: Secondary | ICD-10-CM

## 2021-07-06 NOTE — Telephone Encounter (Signed)
-----   Message from Ena Dawley sent at 07/03/2021  2:22 PM EDT ----- Can you please, placed a new  referral thank you  . ----- Message ----- From: Ladell Pier, MD Sent: 06/19/2021   3:07 PM EDT To: Ena Dawley  Referred to GYN for postmenopausal bleeding.  No appt as yet.

## 2021-07-09 ENCOUNTER — Other Ambulatory Visit: Payer: Self-pay

## 2021-07-20 ENCOUNTER — Other Ambulatory Visit: Payer: Self-pay

## 2021-08-14 ENCOUNTER — Other Ambulatory Visit: Payer: Self-pay

## 2021-08-14 ENCOUNTER — Ambulatory Visit: Payer: Self-pay | Attending: Internal Medicine | Admitting: Internal Medicine

## 2021-08-14 DIAGNOSIS — Z538 Procedure and treatment not carried out for other reasons: Secondary | ICD-10-CM

## 2021-08-14 NOTE — Progress Notes (Signed)
Opened in error

## 2021-08-24 ENCOUNTER — Other Ambulatory Visit: Payer: Self-pay

## 2021-08-28 ENCOUNTER — Other Ambulatory Visit: Payer: Self-pay

## 2021-08-29 ENCOUNTER — Other Ambulatory Visit: Payer: Self-pay

## 2021-08-29 MED ORDER — ISOSORBIDE DINITRATE 20 MG PO TABS
ORAL_TABLET | ORAL | 3 refills | Status: DC
  Filled 2021-08-29 – 2021-10-31 (×3): qty 90, 30d supply, fill #0
  Filled 2021-10-31 – 2021-12-07 (×2): qty 90, 30d supply, fill #1
  Filled 2022-01-28: qty 90, 30d supply, fill #2

## 2021-08-29 MED ORDER — LISINOPRIL 5 MG PO TABS
ORAL_TABLET | ORAL | 3 refills | Status: DC
  Filled 2021-08-29 – 2021-10-31 (×3): qty 30, 30d supply, fill #0
  Filled 2021-10-31 – 2021-12-07 (×2): qty 30, 30d supply, fill #1
  Filled 2022-01-14: qty 30, 30d supply, fill #2

## 2021-09-05 ENCOUNTER — Other Ambulatory Visit: Payer: Self-pay

## 2021-09-24 ENCOUNTER — Other Ambulatory Visit: Payer: Self-pay

## 2021-10-18 ENCOUNTER — Telehealth: Payer: Self-pay | Admitting: Internal Medicine

## 2021-10-18 NOTE — Telephone Encounter (Signed)
Copied from [place] [id number]. Topic: Appointment Scheduling - Scheduling Inquiry for Clinic >> Oct 18, 2021 12:24 PM Tessa Lerner A wrote: Reason for CRM: The patient would like to be contacted by Mr Clifton James when possible  The patient would like to be seen for financial counseling   Please contact further  I return Pt call, schedule a financial appt

## 2021-10-19 ENCOUNTER — Encounter: Payer: Self-pay | Admitting: Internal Medicine

## 2021-10-19 ENCOUNTER — Ambulatory Visit: Payer: Self-pay | Attending: Internal Medicine | Admitting: Internal Medicine

## 2021-10-19 DIAGNOSIS — I129 Hypertensive chronic kidney disease with stage 1 through stage 4 chronic kidney disease, or unspecified chronic kidney disease: Secondary | ICD-10-CM

## 2021-10-19 DIAGNOSIS — Z1211 Encounter for screening for malignant neoplasm of colon: Secondary | ICD-10-CM

## 2021-10-19 DIAGNOSIS — N184 Chronic kidney disease, stage 4 (severe): Secondary | ICD-10-CM

## 2021-10-19 DIAGNOSIS — E782 Mixed hyperlipidemia: Secondary | ICD-10-CM

## 2021-10-19 DIAGNOSIS — N950 Postmenopausal bleeding: Secondary | ICD-10-CM

## 2021-10-19 DIAGNOSIS — E1169 Type 2 diabetes mellitus with other specified complication: Secondary | ICD-10-CM

## 2021-10-19 NOTE — Progress Notes (Signed)
Patient ID: [name], female   DOB: 1956/03/04, 66 y.o.   MRN: [id number] Virtual Visit via Telephone Note  I connected with [name] on 10/19/2021 at 11:23 a.m by telephone and verified that I am speaking with the correct person using two identifiers  Location: Patient: home Provider: office  Participants: Myself Patient   I discussed the limitations, risks, security and privacy concerns of performing an evaluation and management service by telephone and the availability of in person appointments. I also discussed with the patient that there may be a patient responsible charge related to this service. The patient expressed understanding and agreed to proceed.   History of Present Illness: Patient with history of HTN, HL, CVA 2009, CKD 3-4, obesity.  Patient last evaluated 06/2021.  Purpose of today's visit is chronic disease management.  DM/obesity:  reports she is working on changing eating habits.  Eating more veggies, egg whites and wheat bread. -pt had decline DM testing supplies on last visit -doing cardio exercises almost daily for about 40-60 -no eye exam as yet.  No insurance  HYPERTENSION Currently taking: see medication list.  She is on lisinopril, amlodipine 7.5 mg daily, furosemide 40 mg twice a day, hydralazine 100 mg 3 times a day, Isordil 20 mg 3 times a day and labetalol 200 mg 3 times a day Med Adherence: [x]  Yes    []  No Medication side effects: []  Yes    [x]  No Adherence with salt restriction: [x]  Yes    []  No Home Monitoring?: [x]  Yes    []  No Monitoring Frequency: once a day Home BP results range: Recent readings 135/72, 137/75, 133/75, 136/80 SOB? []  Yes    [x]  No Chest Pain?: []  Yes    [x]  No Leg swelling?: []  Yes    [x]  No Headaches?: []  Yes    [x]  No Dizziness? []  Yes    [x]  No Comments:   CKD 4:  last saw nephrologist at [facility].  Reports being told to f/u in 6 mths.  She plans to call and schedule f/u appt -still making good urine  HL:   reports compliance with Lipitor.  Post-menopausal bleeding:  pt was suppose to apply for [facility] discount card so that we can order the pelvic UC and referred to GYN.  Pt has appt with financial counselor next wk. she has not had any further spotting.  She also tells me that she visited her cousin in [place] the end of last year.  Her cousin is a gynecologist.  She reports being told by him that he has had patients to where they can develop some spotting after drinking grapefruit juice.  He reassured her that she is okay.  HM: Due for shingles vaccine and pneumonia vaccine 20.  Also due for colon cancer screening. Outpatient Encounter Medications as of 10/19/2021  Medication Sig   amLODipine (NORVASC) 5 MG tablet Take 1.5 tablets (7.5 mg total) by mouth daily.   atorvastatin (LIPITOR) 40 MG tablet Take 1 tablet (40 mg total) by mouth daily.   furosemide (LASIX) 40 MG tablet TAKE 1 TABLET (40 MG TOTAL) BY MOUTH 2 (TWO) TIMES DAILY.   hydrALAZINE (APRESOLINE) 100 MG tablet Take 1 tablet (100 mg total) by mouth 3 times daily.   isosorbide dinitrate (ISORDIL) 20 MG tablet Take 1 tablet (20 mg total) by mouth 3 times daily.   labetalol (NORMODYNE) 200 MG tablet Take 1 tablet (200 mg total) by mouth 3 times daily.   lisinopril (ZESTRIL) 5  MG tablet Take 1 tablet (5 mg total) by mouth nightly.   No facility-administered encounter medications on file as of 10/19/2021.      Observations/Objective: Note direct observation done as this was a telephone encounter. Results for orders placed or performed during the hospital encounter of 79/15/05  Basic metabolic panel  Result Value Ref Range   Sodium 138 135 - 145 mmol/L   Potassium 4.0 3.5 - 5.1 mmol/L   Chloride 106 98 - 111 mmol/L   CO2 24 22 - 32 mmol/L   Glucose, Bld 141 (H) 70 - 99 mg/dL   BUN 36 (H) 8 - 23 mg/dL   Creatinine, Ser 2.60 (H) 0.44 - 1.00 mg/dL   Calcium 9.4 8.9 - 10.3 mg/dL   GFR, Estimated 20 (L) >60 mL/min   Anion gap 8 5 - 15   CBC  Result Value Ref Range   WBC 5.9 4.0 - 10.5 K/uL   RBC 4.00 3.87 - 5.11 MIL/uL   Hemoglobin 11.9 (L) 12.0 - 15.0 g/dL   HCT 36.2 36.0 - 46.0 %   MCV 90.5 80.0 - 100.0 fL   MCH 29.8 26.0 - 34.0 pg   MCHC 32.9 30.0 - 36.0 g/dL   RDW 13.4 11.5 - 15.5 %   Platelets 189 150 - 400 K/uL   nRBC 0.0 0.0 - 0.2 %  Troponin I (High Sensitivity)  Result Value Ref Range   Troponin I (High Sensitivity) 16 <18 ng/L  Troponin I (High Sensitivity)  Result Value Ref Range   Troponin I (High Sensitivity) 18 (H) <18 ng/L     Assessment and Plan: 1. Type 2 diabetes mellitus with morbid obesity ([place]) Commended her on regular exercise.  Encouraged her to keep up the good works.  Continue healthy eating habits.  Encouraged her to get eye exam done at least once a year. - Lipid panel; Future - Comprehensive metabolic panel; Future - Hemoglobin A1c; Future - Microalbumin / creatinine urine ratio; Future  2. Hypertensive kidney disease with stage 4 chronic kidney disease (HCC) Systolic blood pressure close to goal.  She will continue her current medications and low-salt diet.  Encouraged her to call and schedule her follow-up appointment with the kidney specialist at [facility].  3. Mixed hyperlipidemia Continue atorvastatin.  4. Postmenopausal bleeding Advised patient that it would still be good for her to get a formal evaluation by the gynecologist and a pelvic ultrasound.  She will let me know when she is approved for the orange card/cone discount card so I can submit these referrals/orders  5. Screening for colon cancer - Fecal occult blood, imunochemical([facility]); Future   Follow Up Instructions: 4 months with me She will come to the lab on Thursday of next week before she sees our financial counselor to get her blood test done and to get pneumonia vaccine.   I discussed the assessment and treatment plan with the patient. The patient was provided an opportunity to ask  questions and all were answered. The patient agreed with the plan and demonstrated an understanding of the instructions.   The patient was advised to call back or seek an in-person evaluation if the symptoms worsen or if the condition fails to improve as anticipated.  I  Spent 15 minutes on this telephone encounter  Karle Plumber, MD

## 2021-10-22 ENCOUNTER — Telehealth: Payer: Self-pay

## 2021-10-22 NOTE — Telephone Encounter (Signed)
-----   Message from Ladell Pier, MD sent at 10/19/2021 11:45 AM EST ----- Give follow-up appointment with me in 4 months.

## 2021-10-22 NOTE — Telephone Encounter (Signed)
Pt has been scheduled and reminder has been mailed.  

## 2021-10-25 ENCOUNTER — Other Ambulatory Visit: Payer: Self-pay

## 2021-10-25 ENCOUNTER — Ambulatory Visit: Payer: Self-pay | Attending: Internal Medicine

## 2021-10-25 ENCOUNTER — Ambulatory Visit (HOSPITAL_BASED_OUTPATIENT_CLINIC_OR_DEPARTMENT_OTHER): Payer: Self-pay

## 2021-10-25 ENCOUNTER — Ambulatory Visit: Payer: Self-pay

## 2021-10-25 DIAGNOSIS — E1169 Type 2 diabetes mellitus with other specified complication: Secondary | ICD-10-CM

## 2021-10-25 DIAGNOSIS — Z23 Encounter for immunization: Secondary | ICD-10-CM

## 2021-10-26 ENCOUNTER — Telehealth: Payer: Self-pay

## 2021-10-26 LAB — COMPREHENSIVE METABOLIC PANEL
ALT: 18 IU/L (ref 0–32)
AST: 25 IU/L (ref 0–40)
Albumin/Globulin Ratio: 1.6 (ref 1.2–2.2)
Albumin: 4.6 g/dL (ref 3.8–4.8)
Alkaline Phosphatase: 65 IU/L (ref 44–121)
BUN/Creatinine Ratio: 17 (ref 12–28)
BUN: 43 mg/dL — ABNORMAL HIGH (ref 8–27)
Bilirubin Total: 0.3 mg/dL (ref 0.0–1.2)
CO2: 22 mmol/L (ref 20–29)
Calcium: 9.8 mg/dL (ref 8.7–10.3)
Chloride: 105 mmol/L (ref 96–106)
Creatinine, Ser: 2.59 mg/dL — ABNORMAL HIGH (ref 0.57–1.00)
Globulin, Total: 2.8 g/dL (ref 1.5–4.5)
Glucose: 95 mg/dL (ref 70–99)
Potassium: 4.5 mmol/L (ref 3.5–5.2)
Sodium: 142 mmol/L (ref 134–144)
Total Protein: 7.4 g/dL (ref 6.0–8.5)
eGFR: 20 mL/min/{1.73_m2} — ABNORMAL LOW (ref >59)

## 2021-10-26 LAB — LIPID PANEL
Chol/HDL Ratio: 2.2 ratio (ref 0.0–4.4)
Cholesterol, Total: 192 mg/dL (ref 100–199)
HDL: 88 mg/dL (ref >39)
LDL Chol Calc (NIH): 89 mg/dL (ref 0–99)
Triglycerides: 83 mg/dL (ref 0–149)
VLDL Cholesterol Cal: 15 mg/dL (ref 5–40)

## 2021-10-26 LAB — MICROALBUMIN / CREATININE URINE RATIO
Creatinine, Urine: 160 mg/dL
Microalb/Creat Ratio: 12 mg/g creat (ref 0–29)
Microalbumin, Urine: 19.9 ug/mL

## 2021-10-26 LAB — HEMOGLOBIN A1C
Est. average glucose Bld gHb Est-mCnc: 134 mg/dL
Hgb A1c MFr Bld: 6.3 % — ABNORMAL HIGH (ref 4.8–5.6)

## 2021-10-26 NOTE — Telephone Encounter (Signed)
Contacted pt to go over lab results pt wasn't home spoke with pt husband and made aware of results and if he or she has any questions to give [place] a call

## 2021-10-26 NOTE — Progress Notes (Signed)
Let patient know that her A1c is 6.3 meaning the diabetes is under good control.  Kidney function is not 100% but stable compared to when last checked in July.  Liver function test normal.  Cholesterol level good.

## 2021-10-31 ENCOUNTER — Other Ambulatory Visit: Payer: Self-pay

## 2021-11-07 ENCOUNTER — Telehealth: Payer: Self-pay | Admitting: Internal Medicine

## 2021-11-07 NOTE — Telephone Encounter (Signed)
Pt was sent a letter from financial dept. Inform them, that the application they submitted was incomplete, since they were missing some documentation at the time of the appointment, Pt need to reschedule and resubmit all new papers and application for CAFA and [place], P.S. old documents has been sent back by mail to the Pt and Pt. need to make a new appt. 

## 2021-12-05 ENCOUNTER — Other Ambulatory Visit: Payer: Self-pay

## 2021-12-07 ENCOUNTER — Other Ambulatory Visit: Payer: Self-pay

## 2022-01-14 ENCOUNTER — Other Ambulatory Visit: Payer: Self-pay

## 2022-01-15 ENCOUNTER — Other Ambulatory Visit: Payer: Self-pay

## 2022-01-15 ENCOUNTER — Other Ambulatory Visit: Payer: Self-pay | Admitting: Internal Medicine

## 2022-01-16 ENCOUNTER — Other Ambulatory Visit: Payer: Self-pay

## 2022-01-16 MED ORDER — ATORVASTATIN CALCIUM 40 MG PO TABS
40.0000 mg | ORAL_TABLET | Freq: Every day | ORAL | 2 refills | Status: DC
  Filled 2022-01-16: qty 30, 30d supply, fill #0
  Filled 2022-02-22 – 2022-03-08 (×2): qty 30, 30d supply, fill #1
  Filled 2022-04-18: qty 30, 30d supply, fill #2

## 2022-01-17 ENCOUNTER — Other Ambulatory Visit: Payer: Self-pay

## 2022-01-28 ENCOUNTER — Other Ambulatory Visit: Payer: Self-pay

## 2022-01-29 ENCOUNTER — Other Ambulatory Visit: Payer: Self-pay

## 2022-01-30 ENCOUNTER — Other Ambulatory Visit: Payer: Self-pay

## 2022-01-31 ENCOUNTER — Other Ambulatory Visit: Payer: Self-pay

## 2022-02-22 ENCOUNTER — Other Ambulatory Visit: Payer: Self-pay

## 2022-02-26 ENCOUNTER — Ambulatory Visit: Payer: Self-pay | Admitting: Internal Medicine

## 2022-02-28 ENCOUNTER — Other Ambulatory Visit: Payer: Self-pay

## 2022-03-07 ENCOUNTER — Ambulatory Visit: Payer: Self-pay | Admitting: Internal Medicine

## 2022-03-08 ENCOUNTER — Other Ambulatory Visit: Payer: Self-pay

## 2022-03-11 ENCOUNTER — Telehealth: Payer: Self-pay | Admitting: Internal Medicine

## 2022-03-11 ENCOUNTER — Other Ambulatory Visit: Payer: Self-pay

## 2022-03-11 NOTE — Telephone Encounter (Signed)
Patient and husband came in stating that they would like to speak to you regarding medicare. They mentioned she just recently got medicare insurance. Husband also mentioned that they  need help finding a healthcare navigator.

## 2022-03-12 ENCOUNTER — Other Ambulatory Visit: Payer: Self-pay

## 2022-03-12 NOTE — Telephone Encounter (Signed)
I called patient's husband back and provided him with the contact information for [facility] with [employer] [place] # [phone] x [phone] and explained that they should be able to help him review options for Medicare advantage plans, prescription drug plans as well as Extra Help and Medicare Savings Program.

## 2022-03-21 ENCOUNTER — Other Ambulatory Visit: Payer: Self-pay

## 2022-03-22 ENCOUNTER — Other Ambulatory Visit: Payer: Self-pay

## 2022-03-22 ENCOUNTER — Other Ambulatory Visit: Payer: Self-pay | Admitting: Pharmacist

## 2022-03-22 ENCOUNTER — Other Ambulatory Visit: Payer: Self-pay | Admitting: Internal Medicine

## 2022-03-22 MED ORDER — ISOSORBIDE DINITRATE 20 MG PO TABS: ORAL_TABLET | ORAL | 0 refills | Status: DC

## 2022-03-22 NOTE — Telephone Encounter (Signed)
Pt needs this med refilled, pt stated she hasn't taken in 5 days... please contact when refilled.   isosorbide dinitrate (ISORDIL) 20 MG tablet

## 2022-03-22 NOTE — Chronic Care Management (AMB) (Signed)
Patient seen by Camila Li Day, PharmD Candidate on 03/22/22 while they were picking up prescriptions at [place] at [facility].   Patient has an automated home blood pressure machine, but have not been checking recently.   Medication review was performed. They are taking medications as prescribed.   The following barriers to adherence were noted: - Needing Refills - needs refills on isosorbide and lisinopril, prescribed by nephrology and patient is due for a refill.  The following interventions were completed:  - Medications were reviewed - Patient was educated on medications, including indication and administration - Collaboration with prescriber: assisted patient in calling nephrology to schedule an appointment so refill request can be addressed.    The patient has follow up scheduled:  PCP: 04/03/22 Nephrology: 07/22/22   Catie Hedwig Morton, PharmD, [place] [phone]

## 2022-03-22 NOTE — Telephone Encounter (Signed)
Pt called and stated she is at the pharmacy and waiting on her RX refill for Isosorbide Dinitrate / pt stated she is going to wait at the pharmacy and asked if refill can be sent asap / pt stated she has been without this medication for 5 days now

## 2022-03-22 NOTE — Telephone Encounter (Signed)
Requested Prescriptions  Pending Prescriptions Disp Refills  . isosorbide dinitrate (ISORDIL) 20 MG tablet 90 tablet 0    Sig: Take 1 tablet (20 mg total) by mouth 3 times daily.     Cardiovascular:  Nitrates Passed - 03/22/2022 11:20 AM      Passed - Last BP in normal range    BP Readings from Last 1 Encounters:  10/19/21 133/75         Passed - Last Heart Rate in normal range    Pulse Readings from Last 1 Encounters:  06/19/21 76         Passed - Valid encounter within last 12 months    Recent Outpatient Visits          5 months ago Type 2 diabetes mellitus with morbid obesity ([place])   [place], MD   7 months ago Appointment canceled by hospital   [place], MD   9 months ago Hypertensive kidney disease with stage 4 chronic kidney disease [facility])   [place], Deborah B, MD   1 year ago Essential hypertension   [place], Deborah B, MD   1 year ago Essential hypertension   [place], MD      Future Appointments            In 1 week Thereasa Solo, Dionne Bucy, PA-C [place]

## 2022-03-22 NOTE — Telephone Encounter (Signed)
Medication Refill - Medication:  isosorbide dinitrate (ISORDIL) 20 MG tablet   Has the patient contacted their pharmacy? Yes.   (Agent: If no, request that the patient contact the pharmacy for the refill. If patient does not wish to contact the pharmacy document the reason why and proceed with request.) (Agent: If yes, when and what did the pharmacy advise?)  Preferred Pharmacy (with phone number or street name): [facility] [place] [address] [place], [place] [address]  Phone:  [phone]  Fax:  [phone]  Has the patient been seen for an appointment in the last year OR does the patient have an upcoming appointment? Yes.    Agent: Please be advised that RX refills may take up to 3 business days. We ask that you follow-up with your pharmacy.

## 2022-03-26 NOTE — Telephone Encounter (Signed)
Medication has been refilled.

## 2022-03-27 ENCOUNTER — Other Ambulatory Visit: Payer: Self-pay

## 2022-03-27 MED ORDER — LISINOPRIL 5 MG PO TABS
ORAL_TABLET | ORAL | 3 refills | Status: DC
  Filled 2022-03-27: qty 30, 30d supply, fill #0

## 2022-03-27 MED ORDER — ISOSORBIDE DINITRATE 20 MG PO TABS
ORAL_TABLET | ORAL | 3 refills | Status: DC
  Filled 2022-03-27: qty 90, 30d supply, fill #0
  Filled 2022-04-18: qty 90, 30d supply, fill #1
  Filled 2022-06-14: qty 90, 30d supply, fill #2

## 2022-03-29 ENCOUNTER — Other Ambulatory Visit (HOSPITAL_COMMUNITY): Payer: Self-pay

## 2022-03-29 ENCOUNTER — Other Ambulatory Visit: Payer: Self-pay

## 2022-03-29 MED ORDER — LISINOPRIL 5 MG PO TABS: ORAL_TABLET | ORAL | 3 refills | Status: DC

## 2022-04-03 ENCOUNTER — Encounter: Payer: Self-pay | Admitting: Physician Assistant

## 2022-04-03 ENCOUNTER — Ambulatory Visit: Payer: Self-pay | Attending: Physician Assistant | Admitting: Physician Assistant

## 2022-04-03 ENCOUNTER — Other Ambulatory Visit: Payer: Self-pay

## 2022-04-03 VITALS — BP 150/80 | HR 75 | Wt 214.8 lb

## 2022-04-03 DIAGNOSIS — R7303 Prediabetes: Secondary | ICD-10-CM

## 2022-04-03 DIAGNOSIS — N184 Chronic kidney disease, stage 4 (severe): Secondary | ICD-10-CM

## 2022-04-03 DIAGNOSIS — I129 Hypertensive chronic kidney disease with stage 1 through stage 4 chronic kidney disease, or unspecified chronic kidney disease: Secondary | ICD-10-CM

## 2022-04-03 DIAGNOSIS — I10 Essential (primary) hypertension: Secondary | ICD-10-CM

## 2022-04-03 MED ORDER — AMLODIPINE BESYLATE 10 MG PO TABS
10.0000 mg | ORAL_TABLET | Freq: Every day | ORAL | 0 refills | Status: DC
  Filled 2022-04-03: qty 30, 30d supply, fill #0
  Filled 2022-05-13: qty 30, 30d supply, fill #1
  Filled 2022-06-14: qty 30, 30d supply, fill #2

## 2022-04-03 NOTE — Progress Notes (Signed)
Patient ID: Bascom Levels, female   DOB: 06-Jun-1956, 66 y.o.   MRN: [id number]   [name], is a 66 y.o. female  [id number]  [id number]  DOB - 11/27/1955  Chief Complaint  Patient presents with   Hypertension   Medication Refill       Subjective:   [name] is a 66 y.o. female here today for visit bc she has not been here to be seen in a while.  Seen by Dr Franne Grip in [place] for nephrology and they called her after labs 1 week ago to hold lisinopril.  She has RF on meds.  Denies HA/dizziness/CP  No problems updated.  ALLERGIES: No Known Allergies  PAST MEDICAL HISTORY: Past Medical History:  Diagnosis Date   Hypertension     MEDICATIONS AT HOME: Prior to Admission medications   Medication Sig Start Date End Date Taking? Authorizing Provider  amLODipine (NORVASC) 10 MG tablet Take 1 tablet (10 mg total) by mouth daily. 04/03/22  Yes Argentina Donovan, PA-C  atorvastatin (LIPITOR) 40 MG tablet Take 1 tablet (40 mg total) by mouth daily. 01/16/22   Ladell Pier, MD  furosemide (LASIX) 40 MG tablet TAKE 1 TABLET (40 MG TOTAL) BY MOUTH 2 (TWO) TIMES DAILY. 06/19/21   Ladell Pier, MD  hydrALAZINE (APRESOLINE) 100 MG tablet Take 1 tablet (100 mg total) by mouth 3 times daily. 04/23/21     isosorbide dinitrate (ISORDIL) 20 MG tablet Take 1 tablet (20 mg total) by mouth 3 times daily. 03/27/22     labetalol (NORMODYNE) 200 MG tablet Take 1 tablet (200 mg total) by mouth 3 times daily. 04/23/21       ROS: Neg HEENT Neg resp Neg cardiac Neg GI Neg GU Neg MS Neg psych Neg neuro  Objective:   Vitals:   04/03/22 1431 04/03/22 1442  BP: (!) 177/89 (!) 150/80  Pulse: 75   SpO2: 92%   Weight: 214 lb 12.8 oz (97.4 kg)    Exam General appearance : Awake, alert, not in any distress. Speech Clear. Not toxic looking HEENT: Atraumatic and NormocephalicNeck: Supple, no JVD. No cervical lymphadenopathy.  Chest: Good air entry bilaterally, CTAB.  No  rales/rhonchi/wheezing CVS: S1 S2 regular, no murmurs.  Extremities: B/L Lower Ext shows no edema, both legs are warm to touch Neurology: Awake alert, and oriented X 3, CN II-XII intact, Non focal Skin: No Rash  Data Review Lab Results  Component Value Date   HGBA1C 6.3 (H) 10/25/2021   HGBA1C 6.5 (H) 03/20/2021   HGBA1C 5.9 (H) 05/07/2010    Assessment & Plan   1. Hypertensive kidney disease with stage 4 chronic kidney disease (HCC) - Basic metabolic panel; Future  2. Essential hypertension Uncontrolled-continue other meds and increase amlodipine from 7.5 to  - amLODipine (NORVASC) 10 MG tablet; Take 1 tablet (10 mg total) by mouth daily.  Dispense: 90 tablet; Refill: 0 - Basic metabolic panel; Future  3. Prediabetes I have had a lengthy discussion and provided education about insulin resistance and the intake of too much sugar/refined carbohydrates.  I have advised the patient to work at a goal of eliminating sugary drinks, candy, desserts, sweets, refined sugars, processed foods, and white carbohydrates.  The patient expresses understanding.  - Hemoglobin A1c; Future    Return for 3 weeks with Lurena Joiner for BP and 3 months with PCP for chronic conditions.  .  The patient was given clear instructions to go to ER or return to medical center if  symptoms don't improve, worsen or new problems develop. The patient verbalized understanding. The patient was told to call to get lab results if they haven't heard anything in the next week.      Freeman Caldron, PA-C [facility] and [place], [place]   04/03/2022, 2:56 PM

## 2022-04-03 NOTE — Patient Instructions (Signed)
Stop amlodipine 7.5mg .  start amlodipine 10mg  daily.  Continue your other medications(you have RF of all at our pharmacy)  Check blood pressure daily and record and bring those numbers to your next visit.

## 2022-04-05 ENCOUNTER — Other Ambulatory Visit: Payer: Self-pay

## 2022-04-08 ENCOUNTER — Other Ambulatory Visit: Payer: Self-pay

## 2022-04-10 ENCOUNTER — Ambulatory Visit: Payer: Self-pay | Admitting: *Deleted

## 2022-04-10 ENCOUNTER — Other Ambulatory Visit: Payer: Self-pay

## 2022-04-10 NOTE — Telephone Encounter (Signed)
Reason for Disposition  [1] Caller has NON-URGENT medicine question about med that PCP prescribed AND [2] triager unable to answer question    Amlodipine dose changed to 7.5 mg from 10 mg  Answer Assessment - Initial Assessment Questions 1. NAME of MEDICATION: "What medicine are you calling about?"     Amlodipine 10 mg.    Now taking 7.5 mg because the 10 mg is making her dizzy and lightheaded.  2. QUESTION: "What is your question?" (e.g., double dose of medicine, side effect)      I went to [facility] and saw  Dr. Toni Arthurs.  Gave me 7.5 mg of amlodipine because the 10 mg was making me so dizzy and lightheaded and my BP was low.  I wanted to let Dr. Wynetta Emery know my dose is lowered to 7.5 mg.     I saw Freeman Caldron at [facility] last Wed.   I took 10 mg made me  real dizzy and lightheaded.  BP 109/57, P 65,  108/57 an hour later.  I was not feeling good when I saw my doctor in [place] so she wrote an rx for me for the 7.5 mg so I would not be dizzy.   I went back to the 7.5 mg dose.   I'm not taking the 10 mg it makes me dizzy and lightheaded and BP so low.   I just wanted to let Dr. Wynetta Emery be aware of the dose change of the amlodipine. 3. PRESCRIBING HCP: "Who prescribed it?" Reason: if prescribed by specialist, call should be referred to that group.     Dr. Karle Plumber and a doctor in [facility]  Dr. Toni Arthurs refilled the 7.5 mg for me, so I'm taking that instead of the 10 mg.    I started the 7.5 mg amlodipine on Monday April 08, 2022.  I'm no longer dizzy and I feel fine on this dose.    4. SYMPTOMS: "Do you have any symptoms?"     Lightheaded , and dizzy on 10 mg.    Fine with 7.5 mg amlodipine 5. SEVERITY: If symptoms are present, ask "Are they mild, moderate or severe?"     Moderate   I'm no longer dizzy because I'm taking the 7.5 mg instead of the 100 mg.   6. PREGNANCY:  "Is there any chance that you are pregnant?" "When was your last menstrual period?"     N/A  Protocols used:  Medication Question Call-A-AH

## 2022-04-10 NOTE — Telephone Encounter (Signed)
I have reviewed note from RN. Norvasc dose decreased to 7.5 mg from 10 mg due to dizziness and low BP>

## 2022-04-10 NOTE — Telephone Encounter (Signed)
  Chief Complaint: Amlodipine dose changed from 10 mg to 7.5 mg.  Wanted to let Dr. Wynetta Emery know. Symptoms: Was lightheaded and dizzy with a low BP on the 10 mg. Frequency: N/A Pertinent Negatives: Patient denies being dizzy, lightheaded or having a low BP since dose reduced to 7.5 mg on April 08, 2022. Disposition: [] ED /[] Urgent Care (no appt availability in office) / [] Appointment(In office/virtual)/ []  [place] [facility]/ [] Home Care/ [] Refused Recommended Disposition /[] [place] [facility]/ [x]  Follow-up with PCP Additional Notes: Information sent to [facility] for Dr. Karle Plumber

## 2022-04-10 NOTE — Telephone Encounter (Signed)
FYI

## 2022-04-15 ENCOUNTER — Other Ambulatory Visit: Payer: Self-pay

## 2022-04-18 ENCOUNTER — Other Ambulatory Visit: Payer: Self-pay

## 2022-04-19 ENCOUNTER — Other Ambulatory Visit: Payer: Self-pay

## 2022-05-10 ENCOUNTER — Ambulatory Visit: Payer: Self-pay | Admitting: Pharmacist

## 2022-05-13 ENCOUNTER — Other Ambulatory Visit: Payer: Self-pay | Admitting: Internal Medicine

## 2022-05-14 ENCOUNTER — Other Ambulatory Visit: Payer: Self-pay

## 2022-05-14 MED ORDER — ATORVASTATIN CALCIUM 40 MG PO TABS
40.0000 mg | ORAL_TABLET | Freq: Every day | ORAL | 2 refills | Status: DC
  Filled 2022-05-14: qty 30, 30d supply, fill #0
  Filled 2022-06-14: qty 30, 30d supply, fill #1
  Filled 2022-07-30 – 2022-08-07 (×2): qty 30, 30d supply, fill #2

## 2022-05-14 NOTE — Telephone Encounter (Signed)
Requested Prescriptions  Pending Prescriptions Disp Refills  . atorvastatin (LIPITOR) 40 MG tablet 30 tablet 2    Sig: Take 1 tablet (40 mg total) by mouth daily.     Cardiovascular:  Antilipid - Statins Failed - 05/13/2022 10:17 PM      Failed - Lipid Panel in normal range within the last 12 months    Cholesterol, Total  Date Value Ref Range Status  10/25/2021 192 100 - 199 mg/dL Final   LDL Chol Calc (NIH)  Date Value Ref Range Status  10/25/2021 89 0 - 99 mg/dL Final   Direct LDL  Date Value Ref Range Status  05/07/2010 108 (H) mg/dL Final    Comment:    See lab report for associated comment(s)   HDL  Date Value Ref Range Status  10/25/2021 88 >39 mg/dL Final   Triglycerides  Date Value Ref Range Status  10/25/2021 83 0 - 149 mg/dL Final         Passed - Patient is not pregnant      Passed - Valid encounter within last 12 months    Recent Outpatient Visits          1 month ago Hypertensive kidney disease with stage 4 chronic kidney disease ([place])   [place], [place], [place]   6 months ago Type 2 diabetes mellitus with morbid obesity ([place])   [place] Ladell Pier, MD   9 months ago Appointment canceled by hospital   [place], Deborah B, MD   10 months ago Hypertensive kidney disease with stage 4 chronic kidney disease [facility])   [place], Deborah B, MD   1 year ago Essential hypertension   [place], MD      Future Appointments            In 1 month Daisy Blossom, Jarome M[place], [place]   In 1 month Wynetta Emery, Dalbert Batman, MD [place]

## 2022-05-16 ENCOUNTER — Other Ambulatory Visit (HOSPITAL_COMMUNITY): Payer: Self-pay

## 2022-05-16 ENCOUNTER — Other Ambulatory Visit: Payer: Self-pay

## 2022-05-29 ENCOUNTER — Other Ambulatory Visit: Payer: Self-pay

## 2022-05-29 MED ORDER — HYDRALAZINE HCL 100 MG PO TABS
ORAL_TABLET | ORAL | 0 refills | Status: DC
  Filled 2022-05-29 (×3): qty 90, 30d supply, fill #0

## 2022-05-29 MED ORDER — LABETALOL HCL 200 MG PO TABS
ORAL_TABLET | ORAL | 0 refills | Status: DC
  Filled 2022-05-29 (×3): qty 90, 30d supply, fill #0

## 2022-05-31 ENCOUNTER — Other Ambulatory Visit: Payer: Self-pay

## 2022-06-14 ENCOUNTER — Other Ambulatory Visit: Payer: Self-pay

## 2022-06-17 ENCOUNTER — Ambulatory Visit: Payer: Self-pay | Admitting: Pharmacist

## 2022-06-17 ENCOUNTER — Other Ambulatory Visit: Payer: Self-pay

## 2022-07-09 ENCOUNTER — Other Ambulatory Visit: Payer: Self-pay

## 2022-07-09 ENCOUNTER — Ambulatory Visit: Payer: Self-pay | Attending: Internal Medicine | Admitting: Internal Medicine

## 2022-07-09 ENCOUNTER — Encounter: Payer: Self-pay | Admitting: Internal Medicine

## 2022-07-09 DIAGNOSIS — Z1211 Encounter for screening for malignant neoplasm of colon: Secondary | ICD-10-CM

## 2022-07-09 DIAGNOSIS — E782 Mixed hyperlipidemia: Secondary | ICD-10-CM

## 2022-07-09 DIAGNOSIS — Z1231 Encounter for screening mammogram for malignant neoplasm of breast: Secondary | ICD-10-CM

## 2022-07-09 DIAGNOSIS — I129 Hypertensive chronic kidney disease with stage 1 through stage 4 chronic kidney disease, or unspecified chronic kidney disease: Secondary | ICD-10-CM

## 2022-07-09 DIAGNOSIS — N184 Chronic kidney disease, stage 4 (severe): Secondary | ICD-10-CM

## 2022-07-09 DIAGNOSIS — E1169 Type 2 diabetes mellitus with other specified complication: Secondary | ICD-10-CM

## 2022-07-09 DIAGNOSIS — Z23 Encounter for immunization: Secondary | ICD-10-CM

## 2022-07-09 LAB — POCT GLYCOSYLATED HEMOGLOBIN (HGB A1C): HbA1c, POC (controlled diabetic range): 5.8 % (ref 0.0–7.0)

## 2022-07-09 LAB — GLUCOSE, POCT (MANUAL RESULT ENTRY): POC Glucose: 135 mg/dl — AB (ref 70–99)

## 2022-07-09 MED ORDER — LABETALOL HCL 200 MG PO TABS
ORAL_TABLET | ORAL | 3 refills | Status: DC
  Filled 2022-07-09: qty 90, 30d supply, fill #0
  Filled 2022-08-07: qty 90, 30d supply, fill #1
  Filled 2022-12-18: qty 90, 30d supply, fill #2
  Filled 2023-02-02: qty 90, 30d supply, fill #3
  Filled 2023-03-07 (×2): qty 90, 30d supply, fill #4

## 2022-07-09 MED ORDER — ZOSTER VAC RECOMB ADJUVANTED 50 MCG/0.5ML IM SUSR: 0.5000 mL | Freq: Once | INTRAMUSCULAR | 0 refills | Status: AC

## 2022-07-09 MED ORDER — AMLODIPINE BESYLATE 10 MG PO TABS
5.0000 mg | ORAL_TABLET | Freq: Two times a day (BID) | ORAL | 2 refills | Status: DC
  Filled 2022-07-09: qty 30, 30d supply, fill #0
  Filled 2022-08-07: qty 30, 30d supply, fill #1
  Filled 2022-11-04: qty 90, 90d supply, fill #2

## 2022-07-09 MED ORDER — HYDRALAZINE HCL 100 MG PO TABS
ORAL_TABLET | ORAL | 2 refills | Status: DC
  Filled 2022-07-09: qty 90, 30d supply, fill #0
  Filled 2022-08-07: qty 90, 30d supply, fill #1
  Filled 2022-12-18: qty 90, 30d supply, fill #2
  Filled 2023-02-02: qty 90, 30d supply, fill #3
  Filled 2023-03-07 (×2): qty 90, 30d supply, fill #4

## 2022-07-09 MED ORDER — ISOSORBIDE DINITRATE 20 MG PO TABS
ORAL_TABLET | ORAL | 6 refills | Status: DC
  Filled 2022-07-09: qty 90, 30d supply, fill #0
  Filled 2022-08-07: qty 90, 30d supply, fill #1
  Filled 2022-11-15: qty 90, 30d supply, fill #2

## 2022-07-09 NOTE — Progress Notes (Signed)
Patient ID: [name], female    DOB: 1955-12-13  MRN: [id number]  CC: Hypertension (HTN f/u. Swollen L ankle X4 mo. Velta Addison flu vax. )   Subjective: [name] is a 66 y.o. female who presents for chronic ds management Her concerns today include:  Patient with history of DM type 2, HTN, HL, CVA 2009, CKD 3-4, obesity.    HTN/CKD 4: Last saw [facility] nephrology 04/2021.  Pt reports last appt was postpone by them until 09/2022 Reports compliance with Hydralazine 100 mg TID, Isosorbide 20 mg TID, Norvasc  7.5 mg (suppose to be on 10 mg but increase dose caused dizziness) Labetalol 200 mg TID, Furosemide 40 mg BID.  She took medicines already for today. -checks BP 1-3 times a wk. Does not have log with her.  Reports readings of 135/85, 140/75, 130/90 -limits salt. No CP/SOB.  Some swelling in LT ankle  HL: taking and tolerating Lipitor 40 mg  DM/Obesity:   Results for orders placed or performed in visit on 07/09/22  POCT glucose (manual entry)  Result Value Ref Range   POC Glucose 135 (A) 70 - 99 mg/dl  POCT glycosylated hemoglobin (Hb A1C)  Result Value Ref Range   Hemoglobin A1C     HbA1c POC (<> result, manual entry)     HbA1c, POC (prediabetic range)     HbA1c, POC (controlled diabetic range) 5.8 0.0 - 7.0 %   Eating a lot of veggies and fruits. Eats small portions.  Drinks mainly water.  Wgh has stayed stable Does not check BS No blurred vision.  Last eye exam was 2 yrs ago.  HM:  due for MMG and colon CA screen, eye exam.  Due for flu shot and Shingrix.   Patient Active Problem List   Diagnosis Date Noted   Type 2 diabetes mellitus with morbid obesity ([place]) 06/19/2021   Postmenopausal bleeding 12/12/2020   History of abnormal cervical Pap smear 12/12/2020   CKD (chronic kidney disease) stage 4, GFR 15-29 ml/min ([place]) 03/23/2020   LGSIL on Pap smear of cervix 12/29/2019   CKD (chronic kidney disease) stage 3, GFR 30-59 ml/min ([place]) 08/14/2018   Essential hypertension  05/15/2018   Reactive depression 05/15/2018   History of CVA (cerebrovascular accident) without residual deficits 05/15/2018     Current Outpatient Medications on File Prior to Visit  Medication Sig Dispense Refill   atorvastatin (LIPITOR) 40 MG tablet Take 1 tablet (40 mg total) by mouth daily. 30 tablet 2   furosemide (LASIX) 40 MG tablet TAKE 1 TABLET (40 MG TOTAL) BY MOUTH 2 (TWO) TIMES DAILY. 180 tablet 4   No current facility-administered medications on file prior to visit.    No Known Allergies  Social History   Socioeconomic History   Marital status: Married    Spouse name: Not on file   Number of children: Not on file   Years of education: Not on file   Highest education level: Not on file  Occupational History   Occupation: retired Therapist, sports  Tobacco Use   Smoking status: Never   Smokeless tobacco: Never  Vaping Use   Vaping Use: Never used  Substance and Sexual Activity   Alcohol use: No   Drug use: Not on file   Sexual activity: Not Currently  Other Topics Concern   Not on file  Social History Narrative   Not on file   Social Determinants of Health   Financial Resource Strain: Not on file  Food Insecurity:  Not on file  Transportation Needs: Not on file  Physical Activity: Not on file  Stress: Not on file  Social Connections: Not on file  Intimate Partner Violence: Not on file    Family History  Problem Relation Age of Onset   Stroke Mother     Past Surgical History:  Procedure Laterality Date   CESAREAN SECTION      ROS: Review of Systems Negative except as stated above  PHYSICAL EXAM: BP (!) 153/89   Pulse 67   Temp 98.1 F (36.7 C) (Oral)   Ht 5\' 1"  (1.549 m)   Wt 213 lb 3.2 oz (96.7 kg)   SpO2 95%   BMI 40.28 kg/m   Wt Readings from Last 3 Encounters:  07/09/22 213 lb 3.2 oz (96.7 kg)  04/03/22 214 lb 12.8 oz (97.4 kg)  06/19/21 213 lb 12.8 oz (97 kg)    Physical Exam  General appearance - alert, well appearing, older African  female and in no distress Mental status - normal mood, behavior, speech, dress, motor activity, and thought processes Neck - supple, no significant adenopathy Chest - clear to auscultation, no wheezes, rales or rhonchi, symmetric air entry Heart - normal rate, regular rhythm, normal S1, S2, no murmurs, rubs, clicks or gallops Extremities - trace BL LE edema and ankle edema      Latest Ref Rng & Units 10/25/2021    3:32 PM 04/19/2021   10:14 PM 12/12/2020   11:38 AM  CMP  Glucose 70 - 99 mg/dL 95  141  138   BUN 8 - 27 mg/dL 43  36  29   Creatinine 0.57 - 1.00 mg/dL 2.59  2.60  2.23   Sodium 134 - 144 mmol/L 142  138  143   Potassium 3.5 - 5.2 mmol/L 4.5  4.0  4.0   Chloride 96 - 106 mmol/L 105  106  103   CO2 20 - 29 mmol/L 22  24  21    Calcium 8.7 - 10.3 mg/dL 9.8  9.4  9.8   Total Protein 6.0 - 8.5 g/dL 7.4     Total Bilirubin 0.0 - 1.2 mg/dL 0.3     Alkaline Phos 44 - 121 IU/L 65     AST 0 - 40 IU/L 25     ALT 0 - 32 IU/L 18      Lipid Panel     Component Value Date/Time   CHOL 192 10/25/2021 1532   TRIG 83 10/25/2021 1532   HDL 88 10/25/2021 1532   CHOLHDL 2.2 10/25/2021 1532   CHOLHDL 2.2 Ratio 11/29/2010 2115   VLDL 13 11/29/2010 2115   LDLCALC 89 10/25/2021 1532   LDLDIRECT 108 (H) 05/07/2010 1841    CBC    Component Value Date/Time   WBC 5.9 04/19/2021 2214   RBC 4.00 04/19/2021 2214   HGB 11.9 (L) 04/19/2021 2214   HGB 12.5 11/17/2019 1211   HCT 36.2 04/19/2021 2214   HCT 38.2 11/17/2019 1211   PLT 189 04/19/2021 2214   PLT 183 11/17/2019 1211   MCV 90.5 04/19/2021 2214   MCV 89 11/17/2019 1211   MCH 29.8 04/19/2021 2214   MCHC 32.9 04/19/2021 2214   RDW 13.4 04/19/2021 2214   RDW 13.2 11/17/2019 1211   LYMPHSABS 1.7 01/06/2014 1802   MONOABS 0.4 01/06/2014 1802   EOSABS 0.2 01/06/2014 1802   BASOSABS 0.0 01/06/2014 1802    ASSESSMENT AND PLAN:  1. Type 2 diabetes mellitus with morbid obesity (  [place]) At goal.  Commended her on this.  Encouraged  her to continue healthy eating habits.  Encouraged her to try to move as much as she can. - POCT glucose (manual entry) - POCT glycosylated hemoglobin (Hb A1C)  2. Hypertensive kidney disease with stage 4 chronic kidney disease ([place]) Not at goal despite being on 5 medications.  She was unable to tolerate increased dose of amlodipine to 10 mg stating that that dose was causing her to be dizzy.  I recommend cutting the tablet in half and taking 5 mg in the morning and 5 mg in the evening.  She is agreeable to trying this.  Otherwise she will continue current dose of hydralazine, isosorbide, labetalol and furosemide. She plans to keep appointment with the nephrologist in December. - labetalol (NORMODYNE) 200 MG tablet; Take 1 tablet (200 mg total) by mouth 3 times daily.  Dispense: 180 tablet; Refill: 3 - hydrALAZINE (APRESOLINE) 100 MG tablet; Take 1 tablet (100 mg total) by mouth 3 times daily.  Dispense: 180 tablet; Refill: 2 - amLODipine (NORVASC) 10 MG tablet; Take 0.5 tablets (5 mg total) by mouth in the morning and at bedtime.  Dispense: 90 tablet; Refill: 2 - isosorbide dinitrate (ISORDIL) 20 MG tablet; Take 1 tablet (20 mg total) by mouth 3 times daily.  Dispense: 90 tablet; Refill: 6  3. Mixed hyperlipidemia Continue atorvastatin.  4. Need for influenza vaccination - Flu Vaccine QUAD High Dose(Fluad)  5. Encounter for screening mammogram for malignant neoplasm of breast - MM Digital Screening; Future  6. Need for shingles vaccine Patient given prescription for Shingrix to take downstairs to our pharmacy to get the first Shingrix vaccine shot. - Zoster Vaccine Adjuvanted [facility]) injection; Inject 0.5 mLs into the muscle once for 1 dose.  Dispense: 0.5 mL; Refill: 0  7. Screening for colon cancer - Fecal occult blood, imunochemical([facility])   Patient was given the opportunity to ask questions.  Patient verbalized understanding of the plan and was able to repeat key  elements of the plan.   This documentation was completed using Radio producer.  Any transcriptional errors are unintentional.  Orders Placed This Encounter  Procedures   Fecal occult blood, imunochemical([facility])   MM Digital Screening   Flu Vaccine QUAD High Dose(Fluad)   POCT glucose (manual entry)   POCT glycosylated hemoglobin (Hb A1C)     Requested Prescriptions   Signed Prescriptions Disp Refills   labetalol (NORMODYNE) 200 MG tablet 180 tablet 3    Sig: Take 1 tablet (200 mg total) by mouth 3 times daily.   hydrALAZINE (APRESOLINE) 100 MG tablet 180 tablet 2    Sig: Take 1 tablet (100 mg total) by mouth 3 times daily.   Zoster Vaccine Adjuvanted [facility]) injection 0.5 mL 0    Sig: Inject 0.5 mLs into the muscle once for 1 dose.   amLODipine (NORVASC) 10 MG tablet 90 tablet 2    Sig: Take 0.5 tablets (5 mg total) by mouth in the morning and at bedtime.   isosorbide dinitrate (ISORDIL) 20 MG tablet 90 tablet 6    Sig: Take 1 tablet (20 mg total) by mouth 3 times daily.    Return in about 4 months (around 11/09/2022).  Karle Plumber, MD, FACP

## 2022-07-10 ENCOUNTER — Other Ambulatory Visit: Payer: Self-pay

## 2022-07-10 MED ORDER — ZOSTER VAC RECOMB ADJUVANTED 50 MCG/0.5ML IM SUSR
0.5000 mL | Freq: Once | INTRAMUSCULAR | 1 refills | Status: AC
  Filled 2022-07-19: qty 1, 1d supply, fill #0
  Filled 2023-04-01: qty 0.5, 1d supply, fill #1

## 2022-07-12 ENCOUNTER — Other Ambulatory Visit: Payer: Self-pay

## 2022-07-19 ENCOUNTER — Other Ambulatory Visit: Payer: Self-pay

## 2022-07-25 ENCOUNTER — Other Ambulatory Visit: Payer: Self-pay

## 2022-07-25 ENCOUNTER — Other Ambulatory Visit: Payer: Self-pay | Admitting: Internal Medicine

## 2022-07-25 DIAGNOSIS — I129 Hypertensive chronic kidney disease with stage 1 through stage 4 chronic kidney disease, or unspecified chronic kidney disease: Secondary | ICD-10-CM

## 2022-07-25 NOTE — Telephone Encounter (Signed)
last RF 06/19/21 #180 4 RF  Requested Prescriptions  Refused Prescriptions Disp Refills  . furosemide (LASIX) 40 MG tablet 180 tablet 4    Sig: TAKE 1 TABLET (40 MG TOTAL) BY MOUTH 2 (TWO) TIMES DAILY.     Cardiovascular:  Diuretics - Loop Failed - 07/25/2022 11:43 AM      Failed - K in normal range and within 180 days    Potassium  Date Value Ref Range Status  10/25/2021 4.5 3.5 - 5.2 mmol/L Final         Failed - Ca in normal range and within 180 days    Calcium  Date Value Ref Range Status  10/25/2021 9.8 8.7 - 10.3 mg/dL Final   Calcium, Total (PTH)  Date Value Ref Range Status  08/22/2008 9.8 8.4 - 10.5 mg/dL Final   Calcium, Ion  Date Value Ref Range Status  06/13/2012 1.25 (H) 1.12 - 1.23 mmol/L Final         Failed - Na in normal range and within 180 days    Sodium  Date Value Ref Range Status  10/25/2021 142 134 - 144 mmol/L Final         Failed - Cr in normal range and within 180 days    Creatinine, Ser  Date Value Ref Range Status  10/25/2021 2.59 (H) 0.57 - 1.00 mg/dL Final   Creatinine, Urine  Date Value Ref Range Status  09/29/2007 126.9  Final         Failed - Cl in normal range and within 180 days    Chloride  Date Value Ref Range Status  10/25/2021 105 96 - 106 mmol/L Final         Failed - Mg Level in normal range and within 180 days    Magnesium  Date Value Ref Range Status  03/25/2008 2.3 1.5 - 2.5 mg/dL Final         Failed - Last BP in normal range    BP Readings from Last 1 Encounters:  07/09/22 (!) 153/89         Passed - Valid encounter within last 6 months    Recent Outpatient Visits          2 weeks ago Type 2 diabetes mellitus with morbid obesity ([place])   [place], Neoma Laming B, MD   3 months ago Hypertensive kidney disease with stage 4 chronic kidney disease [facility])   [place], [place], [place]   9 months ago Type 2 diabetes mellitus with morbid  obesity [facility])   [place] Ladell Pier, MD   11 months ago Appointment canceled by hospital   [place], Deborah B, MD   1 year ago Hypertensive kidney disease with stage 4 chronic kidney disease [facility])   [place], MD      Future Appointments            In 3 months Ladell Pier, MD [place]

## 2022-07-30 ENCOUNTER — Other Ambulatory Visit: Payer: Self-pay | Admitting: Internal Medicine

## 2022-07-30 ENCOUNTER — Other Ambulatory Visit: Payer: Self-pay

## 2022-07-30 DIAGNOSIS — I129 Hypertensive chronic kidney disease with stage 1 through stage 4 chronic kidney disease, or unspecified chronic kidney disease: Secondary | ICD-10-CM

## 2022-07-30 MED ORDER — FUROSEMIDE 40 MG PO TABS
40.0000 mg | ORAL_TABLET | Freq: Two times a day (BID) | ORAL | 1 refills | Status: DC
  Filled 2022-07-30 – 2022-08-07 (×2): qty 180, 90d supply, fill #0
  Filled 2022-10-02: qty 60, 30d supply, fill #1
  Filled 2022-11-15: qty 60, 30d supply, fill #2
  Filled 2023-01-04: qty 60, 30d supply, fill #3

## 2022-08-01 ENCOUNTER — Other Ambulatory Visit: Payer: Self-pay

## 2022-08-06 ENCOUNTER — Other Ambulatory Visit: Payer: Self-pay

## 2022-08-07 ENCOUNTER — Other Ambulatory Visit: Payer: Self-pay

## 2022-09-10 ENCOUNTER — Ambulatory Visit: Payer: Self-pay

## 2022-09-23 ENCOUNTER — Other Ambulatory Visit (HOSPITAL_COMMUNITY): Payer: Self-pay

## 2022-09-23 MED ORDER — ISOSORBIDE DINITRATE 20 MG PO TABS
20.0000 mg | ORAL_TABLET | Freq: Three times a day (TID) | ORAL | 3 refills | Status: DC
  Filled 2022-09-23 – 2022-09-24 (×2): qty 90, 30d supply, fill #0
  Filled 2022-12-18: qty 90, 30d supply, fill #1
  Filled 2023-02-02: qty 90, 30d supply, fill #2
  Filled 2023-03-07 (×2): qty 90, 30d supply, fill #3
  Filled 2023-04-11: qty 90, 30d supply, fill #4
  Filled 2023-05-23: qty 90, 30d supply, fill #5
  Filled 2023-06-24 (×3): qty 90, 30d supply, fill #6
  Filled 2023-08-05 – 2023-08-06 (×2): qty 90, 30d supply, fill #7

## 2022-09-23 MED ORDER — AMLODIPINE BESYLATE 10 MG PO TABS
5.0000 mg | ORAL_TABLET | Freq: Two times a day (BID) | ORAL | 3 refills | Status: DC
  Filled 2022-09-23: qty 90, 90d supply, fill #0
  Filled 2022-09-24: qty 30, 30d supply, fill #0

## 2022-09-23 MED ORDER — HYDRALAZINE HCL 100 MG PO TABS
100.0000 mg | ORAL_TABLET | Freq: Three times a day (TID) | ORAL | 3 refills | Status: DC
  Filled 2022-09-23 – 2022-09-24 (×2): qty 90, 30d supply, fill #0
  Filled 2022-10-02: qty 90, 30d supply, fill #1
  Filled 2022-11-15: qty 90, 30d supply, fill #2
  Filled 2023-04-11: qty 90, 30d supply, fill #3
  Filled 2023-05-23: qty 90, 30d supply, fill #4
  Filled 2023-06-24 (×3): qty 90, 30d supply, fill #5
  Filled 2023-08-05 – 2023-08-06 (×2): qty 90, 30d supply, fill #6
  Filled 2023-09-14: qty 90, 30d supply, fill #7

## 2022-09-23 MED ORDER — LABETALOL HCL 200 MG PO TABS
200.0000 mg | ORAL_TABLET | Freq: Three times a day (TID) | ORAL | 3 refills | Status: DC
  Filled 2022-09-23 – 2022-09-24 (×2): qty 90, 30d supply, fill #0
  Filled 2022-10-02: qty 90, 30d supply, fill #1
  Filled 2022-11-15: qty 90, 30d supply, fill #2
  Filled 2023-04-11: qty 90, 30d supply, fill #3
  Filled 2023-05-23: qty 90, 30d supply, fill #4
  Filled 2023-06-24 (×3): qty 90, 30d supply, fill #5
  Filled 2023-08-05 – 2023-08-06 (×2): qty 90, 30d supply, fill #6
  Filled 2023-09-14: qty 90, 30d supply, fill #7

## 2022-09-24 ENCOUNTER — Other Ambulatory Visit: Payer: Self-pay

## 2022-09-25 ENCOUNTER — Other Ambulatory Visit: Payer: Self-pay

## 2022-09-27 ENCOUNTER — Other Ambulatory Visit (HOSPITAL_COMMUNITY): Payer: Self-pay

## 2022-10-02 ENCOUNTER — Other Ambulatory Visit: Payer: Self-pay

## 2022-10-02 ENCOUNTER — Other Ambulatory Visit: Payer: Self-pay | Admitting: Internal Medicine

## 2022-10-02 MED ORDER — ATORVASTATIN CALCIUM 40 MG PO TABS
40.0000 mg | ORAL_TABLET | Freq: Every day | ORAL | 1 refills | Status: DC
  Filled 2022-10-02: qty 30, 30d supply, fill #0
  Filled 2022-11-04: qty 30, 30d supply, fill #1

## 2022-11-04 ENCOUNTER — Other Ambulatory Visit: Payer: Self-pay

## 2022-11-05 ENCOUNTER — Other Ambulatory Visit: Payer: Self-pay

## 2022-11-05 ENCOUNTER — Other Ambulatory Visit (HOSPITAL_COMMUNITY): Payer: Self-pay

## 2022-11-12 ENCOUNTER — Ambulatory Visit: Payer: Self-pay | Admitting: Internal Medicine

## 2022-11-15 ENCOUNTER — Other Ambulatory Visit: Payer: Self-pay

## 2022-12-18 ENCOUNTER — Other Ambulatory Visit: Payer: Self-pay | Admitting: Internal Medicine

## 2022-12-18 ENCOUNTER — Other Ambulatory Visit: Payer: Self-pay

## 2022-12-19 ENCOUNTER — Other Ambulatory Visit: Payer: Self-pay

## 2022-12-19 MED ORDER — ATORVASTATIN CALCIUM 40 MG PO TABS
40.0000 mg | ORAL_TABLET | Freq: Every day | ORAL | 0 refills | Status: DC
  Filled 2022-12-19: qty 90, 90d supply, fill #0

## 2023-01-06 ENCOUNTER — Other Ambulatory Visit: Payer: Self-pay

## 2023-01-07 ENCOUNTER — Telehealth: Payer: Self-pay | Admitting: Internal Medicine

## 2023-01-07 ENCOUNTER — Other Ambulatory Visit: Payer: Self-pay

## 2023-01-07 DIAGNOSIS — I129 Hypertensive chronic kidney disease with stage 1 through stage 4 chronic kidney disease, or unspecified chronic kidney disease: Secondary | ICD-10-CM

## 2023-01-07 NOTE — Telephone Encounter (Signed)
Medication Refill - Medication: Amlodipine 5 mg  Has the patient contacted their pharmacy? No.  No refills (Agent: If no, request that the patient contact the pharmacy for the refill. If patient does not wish to contact the pharmacy document the reason why and proceed with request.) (Agent: If yes, when and what did the pharmacy advise?)  Preferred Pharmacy (with phone number or street name): [facility] Has the patient been seen for an appointment in the last year OR does the patient have an upcoming appointment? Yes.    Agent: Please be advised that RX refills may take up to 3 business days. We ask that you follow-up with your pharmacy.

## 2023-01-08 ENCOUNTER — Other Ambulatory Visit: Payer: Self-pay

## 2023-01-08 ENCOUNTER — Other Ambulatory Visit: Payer: Self-pay | Admitting: Pharmacist

## 2023-01-08 DIAGNOSIS — I129 Hypertensive chronic kidney disease with stage 1 through stage 4 chronic kidney disease, or unspecified chronic kidney disease: Secondary | ICD-10-CM

## 2023-01-08 MED ORDER — AMLODIPINE BESYLATE 10 MG PO TABS: 5.0000 mg | ORAL_TABLET | Freq: Two times a day (BID) | ORAL | 1 refills | Status: DC

## 2023-01-08 MED ORDER — AMLODIPINE BESYLATE 10 MG PO TABS
5.0000 mg | ORAL_TABLET | Freq: Two times a day (BID) | ORAL | 3 refills | Status: DC
  Filled 2023-01-08: qty 90, 90d supply, fill #0
  Filled 2023-03-10: qty 90, 90d supply, fill #1

## 2023-01-08 NOTE — Telephone Encounter (Signed)
Refill is for amlodipine 10mg  - take 0.5 tablet (5mg ) BID. This is on her profile and has been sent to our pharmacy.

## 2023-01-10 ENCOUNTER — Other Ambulatory Visit: Payer: Self-pay

## 2023-01-16 ENCOUNTER — Other Ambulatory Visit: Payer: Self-pay

## 2023-02-03 ENCOUNTER — Other Ambulatory Visit: Payer: Self-pay

## 2023-02-25 ENCOUNTER — Other Ambulatory Visit: Payer: Self-pay | Admitting: Internal Medicine

## 2023-02-25 ENCOUNTER — Other Ambulatory Visit: Payer: Self-pay

## 2023-02-25 ENCOUNTER — Encounter: Payer: Self-pay | Admitting: Internal Medicine

## 2023-02-25 ENCOUNTER — Ambulatory Visit: Payer: Self-pay | Attending: Internal Medicine | Admitting: Internal Medicine

## 2023-02-25 DIAGNOSIS — E1169 Type 2 diabetes mellitus with other specified complication: Secondary | ICD-10-CM

## 2023-02-25 DIAGNOSIS — E782 Mixed hyperlipidemia: Secondary | ICD-10-CM

## 2023-02-25 DIAGNOSIS — N3941 Urge incontinence: Secondary | ICD-10-CM

## 2023-02-25 DIAGNOSIS — Z23 Encounter for immunization: Secondary | ICD-10-CM

## 2023-02-25 DIAGNOSIS — I129 Hypertensive chronic kidney disease with stage 1 through stage 4 chronic kidney disease, or unspecified chronic kidney disease: Secondary | ICD-10-CM

## 2023-02-25 DIAGNOSIS — Z1231 Encounter for screening mammogram for malignant neoplasm of breast: Secondary | ICD-10-CM

## 2023-02-25 DIAGNOSIS — D631 Anemia in chronic kidney disease: Secondary | ICD-10-CM

## 2023-02-25 DIAGNOSIS — Z1211 Encounter for screening for malignant neoplasm of colon: Secondary | ICD-10-CM

## 2023-02-25 DIAGNOSIS — N184 Chronic kidney disease, stage 4 (severe): Secondary | ICD-10-CM

## 2023-02-25 DIAGNOSIS — Z7984 Long term (current) use of oral hypoglycemic drugs: Secondary | ICD-10-CM

## 2023-02-25 LAB — POCT GLYCOSYLATED HEMOGLOBIN (HGB A1C): HbA1c, POC (controlled diabetic range): 6.1 % (ref 0.0–7.0)

## 2023-02-25 LAB — GLUCOSE, POCT (MANUAL RESULT ENTRY): POC Glucose: 141 mg/dl — AB (ref 70–99)

## 2023-02-25 MED ORDER — ZOSTER VAC RECOMB ADJUVANTED 50 MCG/0.5ML IM SUSR
0.5000 mL | Freq: Once | INTRAMUSCULAR | 0 refills | Status: AC
Start: 2023-02-25 — End: 2023-02-25

## 2023-02-25 MED ORDER — ATORVASTATIN CALCIUM 40 MG PO TABS
40.0000 mg | ORAL_TABLET | Freq: Every day | ORAL | 2 refills | Status: DC
Start: 2023-02-25 — End: 2023-10-28
  Filled 2023-02-25 – 2023-03-31 (×2): qty 90, 90d supply, fill #0
  Filled 2023-06-24: qty 30, 30d supply, fill #1
  Filled 2023-06-24: qty 90, 90d supply, fill #1
  Filled 2023-06-24: qty 30, 30d supply, fill #1
  Filled 2023-08-28 (×2): qty 30, 30d supply, fill #2
  Filled 2023-09-26: qty 30, 30d supply, fill #3
  Filled 2023-10-28: qty 30, 30d supply, fill #4

## 2023-02-25 MED ORDER — FUROSEMIDE 40 MG PO TABS
40.0000 mg | ORAL_TABLET | Freq: Two times a day (BID) | ORAL | 1 refills | Status: DC
Start: 2023-02-25 — End: 2023-10-28
  Filled 2023-02-25: qty 180, 90d supply, fill #0
  Filled 2023-06-24: qty 60, 30d supply, fill #1
  Filled 2023-06-24: qty 180, 90d supply, fill #1
  Filled 2023-06-24: qty 60, 30d supply, fill #1
  Filled 2023-08-05 – 2023-08-06 (×2): qty 60, 30d supply, fill #2
  Filled 2023-09-14: qty 60, 30d supply, fill #3

## 2023-02-25 NOTE — Progress Notes (Signed)
Patient ID: [name], female    DOB: 01-19-1956  MRN: [id number]  CC: Diabetes (DM f/u. Med refills - please note duplicate meds/Discuss stopping furosemide)   Subjective: [name] is a 67 y.o. female who presents for chronic ds management Her concerns today include:  Patient with history of DM type 2, HTN, HL, CVA 2009, CKD 3-4, ACD, obesity.     HTN/CKD 4: Reports compliance with Hydralazine 100 mg TID, Isosorbide 20 mg TID, Norvasc 5 mg BID, Labetalol 200 mg TID, Furosemide 40 mg BID.  -Out of furosemide x 2 days.  Not able to hold urine at nights when she gets urge to urinate x 1 mth.  No dysuria.  Wants to know if she can dec Furosemide to once a day.  Usually takes her second dose of furosemide between 3 to 4 PM. -reports checking BP 1x/wk.  Gives range 130-135/80-85 -limits salt in foods NO CP/SOB/LE edema/HA or dizziness -last seen by nephrology at [facility] 09/2022.  Still at CKD 4 with last GFR of 21/creat 2.49.  Has a stable normocytic anemia with Hb 11s range.  HL:  taking and tolerating Lipitor 40 mg  DM: Results for orders placed or performed in visit on 02/25/23  POCT glucose (manual entry)  Result Value Ref Range   POC Glucose 141 (A) 70 - 99 mg/dl  POCT glycosylated hemoglobin (Hb A1C)  Result Value Ref Range   Hemoglobin A1C     HbA1c POC (<> result, manual entry)     HbA1c, POC (prediabetic range)     HbA1c, POC (controlled diabetic range) 6.1 0.0 - 7.0 %  Diet control Eating more veggies and fruits.  Drinks mainly water.   Walking 3x/wk for 1 hr No blurred vision.  Over due for eye exam.    HM: Due for colon cancer screening, mammogram, Pap smear, second shingles vaccine, She is now 66.  She has not applied for Medicare as yet.  Patient Active Problem List   Diagnosis Date Noted   Type 2 diabetes mellitus with morbid obesity (HCC) 06/19/2021   Postmenopausal bleeding 12/12/2020   History of abnormal cervical Pap smear 12/12/2020   CKD  (chronic kidney disease) stage 4, GFR 15-29 ml/min (HCC) 03/23/2020   LGSIL on Pap smear of cervix 12/29/2019   CKD (chronic kidney disease) stage 3, GFR 30-59 ml/min (HCC) 08/14/2018   Essential hypertension 05/15/2018   Reactive depression 05/15/2018   History of CVA (cerebrovascular accident) without residual deficits 05/15/2018     Current Outpatient Medications on File Prior to Visit  Medication Sig Dispense Refill   amLODipine (NORVASC) 10 MG tablet Take 0.5 tablets (5 mg total) by mouth 2 (two) times daily. 90 tablet 3   hydrALAZINE (APRESOLINE) 100 MG tablet Take 1 tablet (100 mg total) by mouth 3 times daily. 180 tablet 2   hydrALAZINE (APRESOLINE) 100 MG tablet Take 1 tablet (100 mg total) by mouth 3 (three) times daily. 180 tablet 3   isosorbide dinitrate (ISORDIL) 20 MG tablet Take 1 tablet (20 mg total) by mouth 3 times daily. 90 tablet 6   isosorbide dinitrate (ISORDIL) 20 MG tablet Take 1 tablet (20 mg total) by mouth 3 (three) times daily. 180 tablet 3   labetalol (NORMODYNE) 200 MG tablet Take 1 tablet (200 mg total) by mouth 3 times daily. 180 tablet 3   labetalol (NORMODYNE) 200 MG tablet Take 1 tablet (200 mg total) by mouth 3 (three) times daily. 180 tablet 3  No current facility-administered medications on file prior to visit.    No Known Allergies  Social History   Socioeconomic History   Marital status: Married    Spouse name: Not on file   Number of children: Not on file   Years of education: Not on file   Highest education level: Not on file  Occupational History   Occupation: retired [employer] fundraiser  Tobacco Use   Smoking status: Never   Smokeless tobacco: Never  Vaping Use   Vaping Use: Never used  Substance and Sexual Activity   Alcohol use: No   Drug use: Not on file   Sexual activity: Not Currently  Other Topics Concern   Not on file  Social History Narrative   Not on file   Social Determinants of Health   Financial Resource Strain: Not on file   Food Insecurity: Not on file  Transportation Needs: Not on file  Physical Activity: Not on file  Stress: Not on file  Social Connections: Not on file  Intimate Partner Violence: Not on file    Family History  Problem Relation Age of Onset   Stroke Mother     Past Surgical History:  Procedure Laterality Date   CESAREAN SECTION      ROS: Review of Systems Negative except as stated above  PHYSICAL EXAM: BP (!) 161/87 (BP Location: Left Arm, Patient Position: Sitting, Cuff Size: Normal)   Pulse 71   Temp 97.8 F (36.6 C) (Oral)   Ht 5\' 1"  (1.549 m)   Wt 223 lb (101.2 kg)   SpO2 95%   BMI 42.14 kg/m   Wt Readings from Last 3 Encounters:  02/25/23 223 lb (101.2 kg)  07/09/22 213 lb 3.2 oz (96.7 kg)  04/03/22 214 lb 12.8 oz (97.4 kg)    Physical Exam BP 163/90 General appearance - alert, well appearing, morbidly obese older African female and in no distress Mental status - normal mood, behavior, speech, dress, motor activity, and thought processes Neck - supple, no significant adenopathy Chest - clear to auscultation, no wheezes, rales or rhonchi, symmetric air entry Heart -regular rate and rhythm.  2/6 systolic ejection murmur heard along the left sternal border. Extremities -trace to 1+ bilateral lower extremity edema. Skin: Hypopigmentation noted on the left lower leg from the upper shin down.  Patient states this is chronic from previous accident she had when she was younger.      Latest Ref Rng & Units 10/25/2021    3:32 PM 04/19/2021   10:14 PM 12/12/2020   11:38 AM  CMP  Glucose 70 - 99 mg/dL 95  161  096   BUN 8 - 27 mg/dL 43  36  29   Creatinine 0.57 - 1.00 mg/dL 0.45  4.09  8.11   Sodium 134 - 144 mmol/L 142  138  143   Potassium 3.5 - 5.2 mmol/L 4.5  4.0  4.0   Chloride 96 - 106 mmol/L 105  106  103   CO2 20 - 29 mmol/L 22  24  21    Calcium 8.7 - 10.3 mg/dL 9.8  9.4  9.8   Total Protein 6.0 - 8.5 g/dL 7.4     Total Bilirubin 0.0 - 1.2 mg/dL 0.3      Alkaline Phos 44 - 121 IU/L 65     AST 0 - 40 IU/L 25     ALT 0 - 32 IU/L 18      Lipid Panel     Component Value  Date/Time   CHOL 192 10/25/2021 1532   TRIG 83 10/25/2021 1532   HDL 88 10/25/2021 1532   CHOLHDL 2.2 10/25/2021 1532   CHOLHDL 2.2 Ratio 11/29/2010 2115   VLDL 13 11/29/2010 2115   LDLCALC 89 10/25/2021 1532   LDLDIRECT 108 (H) 05/07/2010 1841    CBC    Component Value Date/Time   WBC 5.9 04/19/2021 2214   RBC 4.00 04/19/2021 2214   HGB 11.9 (L) 04/19/2021 2214   HGB 12.5 11/17/2019 1211   HCT 36.2 04/19/2021 2214   HCT 38.2 11/17/2019 1211   PLT 189 04/19/2021 2214   PLT 183 11/17/2019 1211   MCV 90.5 04/19/2021 2214   MCV 89 11/17/2019 1211   MCH 29.8 04/19/2021 2214   MCHC 32.9 04/19/2021 2214   RDW 13.4 04/19/2021 2214   RDW 13.2 11/17/2019 1211   LYMPHSABS 1.7 01/06/2014 1802   MONOABS 0.4 01/06/2014 1802   EOSABS 0.2 01/06/2014 1802   BASOSABS 0.0 01/06/2014 1802    ASSESSMENT AND PLAN: 1. Type 2 diabetes mellitus with morbid obesity (HCC) At goal on diet controlled.  Weight is up 10 pounds since last visit in October.  Discussed healthy eating habits.  Encouraged her to continue to exercise regularly. - POCT glucose (manual entry) - POCT glycosylated hemoglobin (Hb A1C) - Microalbumin / creatinine urine ratio  2. Hypertensive kidney disease with stage 4 chronic kidney disease (HCC) Not at goal.  She has been out of furosemide for 2 days.  Refill sent.  She will continue her other blood pressure medications which are Hydralazine 100 mg TID, Isosorbide 20 mg TID, Norvasc 5 mg BID, Labetalol 200 mg TID,  I do not think we should decrease the dose of the furosemide as she does have edema on today's exam.  Recommend taking her second dose of furosemide around 12 noon each day to see if that would help decrease the frequent urination and incontinence during the night. - furosemide (LASIX) 40 MG tablet; Take 1 tablet (40 mg total) by mouth 2 (two)  times daily.  Dispense: 180 tablet; Refill: 1 - Comprehensive metabolic panel - Microalbumin / creatinine urine ratio  3. Mixed hyperlipidemia - atorvastatin (LIPITOR) 40 MG tablet; Take 1 tablet (40 mg total) by mouth daily.  Dispense: 90 tablet; Refill: 2 - Lipid panel  4. Anemia in stage 4 chronic kidney disease (HCC) Stable based on review of the labs from [facility].  5. Urge incontinence See #2 above. Recommend purchasing incontinence pads to use as needed.  6. Screening for colon cancer - Fecal occult blood, imunochemical([facility])  7. Encounter for screening mammogram for malignant neoplasm of breast Mammogram scholarship given. - MM Digital Screening; Future  8. Need for shingles vaccine Due for second Shingrix vaccine.  Printed prescription given today for her to take to the pharmacy downstairs. - Zoster Vaccine Adjuvanted [facility]) injection; Inject 0.5 mLs into the muscle once for 1 dose.  Dispense: 0.5 mL; Refill: 0    Patient was given the opportunity to ask questions.  Patient verbalized understanding of the plan and was able to repeat key elements of the plan.   This documentation was completed using Paediatric nurse.  Any transcriptional errors are unintentional.  Orders Placed This Encounter  Procedures   Fecal occult blood, imunochemical([facility]/Sun[facility])   MM Digital Screening   Lipid panel   Comprehensive metabolic panel   Microalbumin / creatinine urine ratio   POCT glucose (manual entry)   POCT glycosylated hemoglobin (  Hb A1C)     Requested Prescriptions   Signed Prescriptions Disp Refills   atorvastatin (LIPITOR) 40 MG tablet 90 tablet 2    Sig: Take 1 tablet (40 mg total) by mouth daily.   furosemide (LASIX) 40 MG tablet 180 tablet 1    Sig: Take 1 tablet (40 mg total) by mouth 2 (two) times daily.   Zoster Vaccine Adjuvanted [facility]) injection 0.5 mL 0    Sig: Inject 0.5 mLs into the muscle once for 1  dose.    Return in about 4 months (around 06/28/2023) for Appt with [facility] in 4 wks for BP check.  Jonah Blue, MD, FACP

## 2023-02-26 LAB — COMPREHENSIVE METABOLIC PANEL
ALT: 17 IU/L (ref 0–32)
AST: 22 IU/L (ref 0–40)
Albumin/Globulin Ratio: 1.6 (ref 1.2–2.2)
Albumin: 4.4 g/dL (ref 3.9–4.9)
Alkaline Phosphatase: 70 IU/L (ref 44–121)
BUN/Creatinine Ratio: 13 (ref 12–28)
BUN: 25 mg/dL (ref 8–27)
Bilirubin Total: 0.4 mg/dL (ref 0.0–1.2)
CO2: 22 mmol/L (ref 20–29)
Calcium: 9.8 mg/dL (ref 8.7–10.3)
Chloride: 104 mmol/L (ref 96–106)
Creatinine, Ser: 1.98 mg/dL — ABNORMAL HIGH (ref 0.57–1.00)
Globulin, Total: 2.8 g/dL (ref 1.5–4.5)
Glucose: 110 mg/dL — ABNORMAL HIGH (ref 70–99)
Potassium: 3.7 mmol/L (ref 3.5–5.2)
Sodium: 142 mmol/L (ref 134–144)
Total Protein: 7.2 g/dL (ref 6.0–8.5)
eGFR: 28 mL/min/{1.73_m2} — ABNORMAL LOW (ref >59)

## 2023-02-26 LAB — LIPID PANEL
Chol/HDL Ratio: 1.8 ratio (ref 0.0–4.4)
Cholesterol, Total: 187 mg/dL (ref 100–199)
HDL: 102 mg/dL (ref >39)
LDL Chol Calc (NIH): 73 mg/dL (ref 0–99)
Triglycerides: 61 mg/dL (ref 0–149)
VLDL Cholesterol Cal: 12 mg/dL (ref 5–40)

## 2023-02-26 LAB — MICROALBUMIN / CREATININE URINE RATIO
Creatinine, Urine: 52.1 mg/dL
Microalb/Creat Ratio: 358 mg/g creat — ABNORMAL HIGH (ref 0–29)
Microalbumin, Urine: 186.3 ug/mL

## 2023-03-07 ENCOUNTER — Telehealth: Payer: Self-pay

## 2023-03-07 ENCOUNTER — Other Ambulatory Visit: Payer: Self-pay

## 2023-03-07 NOTE — Telephone Encounter (Signed)
Telephoned patient at mobile number. Voice mail not an option at this time. [facility] (scholarship) unable to leave a message.

## 2023-03-10 ENCOUNTER — Other Ambulatory Visit: Payer: Self-pay

## 2023-03-11 ENCOUNTER — Ambulatory Visit: Payer: Self-pay | Admitting: *Deleted

## 2023-03-11 ENCOUNTER — Other Ambulatory Visit: Payer: Self-pay | Admitting: Pharmacist

## 2023-03-11 MED ORDER — AMLODIPINE BESYLATE 5 MG PO TABS
5.0000 mg | ORAL_TABLET | Freq: Two times a day (BID) | ORAL | 1 refills | Status: DC
  Filled 2023-03-11: qty 180, 90d supply, fill #0
  Filled 2023-06-24: qty 60, 30d supply, fill #1
  Filled 2023-06-24: qty 180, 90d supply, fill #1
  Filled 2023-06-24: qty 60, 30d supply, fill #1
  Filled 2023-08-05 – 2023-08-06 (×2): qty 60, 30d supply, fill #2
  Filled 2023-09-26: qty 60, 30d supply, fill #3

## 2023-03-11 NOTE — Telephone Encounter (Signed)
Summary: med management   Pt stated she was advised by the pharmacy to call PCP and let them know she was taking medication amLODipine 5MG  should have taken .5MG  should have cut the .5MG  into two. Stated she is fine no symptoms no questions just wanted to let PCP know.  Pt sounded unsure. Seeking clinical advice.

## 2023-03-11 NOTE — Telephone Encounter (Signed)
Called patient [phone] to review medication issues . Attempted to contact pharmacy listed as well and closed for lunch. No answer, for patient 3 LVMTCB [phone].

## 2023-03-11 NOTE — Telephone Encounter (Signed)
Message from [place] sent at 03/11/2023 11:07 AM EDT  Summary: med management   Pt stated she was advised by the pharmacy to call PCP and let them know she was taking medication amLODipine 5MG  should have taken .5MG  should have cut the .5MG  into two. Stated she is fine no symptoms no questions just wanted to let PCP know.  Pt sounded unsure. Seeking clinical advice.         Chief Complaint:  Pt stated PCP waqs going to order 5 mg tablets so she would not have to split 10 mg tablets. Pt picked up 10 mg tablets that she will have to split- 6 month fill   Disposition: [] ED /[] Urgent Care (no appt availability in office) / [] Appointment(In office/virtual)/ []  [facility]/ [] Home Care/ [] Refused Recommended Disposition /[] [place] [facility]/ [x]  Follow-up with PCP Additional Notes: FYI- pt asking for next fill to be 5 mg amlodipine  Reason for Disposition  [1] Caller has NON-URGENT medicine question about med that PCP prescribed AND [2] triager unable to answer question  Answer Assessment - Initial Assessment Questions 1. NAME of MEDICINE: "What medicine(s) are you calling about?"     amlodipine 2. QUESTION: "What is your question?" (e.g., double dose of medicine, side effect)     Pt stated PCP waqs going to order 5 mg tablets so she would not have to split 10 mg tablets. Pt picked up 10 mg tablets that she will have to split- 6 month fill  3. PRESCRIBER: "Who prescribed the medicine?" Reason: if prescribed by specialist, call should be referred to that group.     PCP 4. SYMPTOMS: "Do you have any symptoms?" If Yes, ask: "What symptoms are you having?"  "How bad are the symptoms (e.g., mild, moderate, severe)     N/a 5. PREGNANCY:  "Is there any chance that you are pregnant?" "When was your last menstrual period?"     N/a  Protocols used: Medication Question Call-A-AH

## 2023-03-12 ENCOUNTER — Other Ambulatory Visit: Payer: Self-pay

## 2023-03-25 ENCOUNTER — Ambulatory Visit: Payer: Self-pay | Admitting: Pharmacist

## 2023-03-25 NOTE — Progress Notes (Unsigned)
   S:     No chief complaint on file.  67 y.o. female who presents for hypertension evaluation, education, and management.  PMH is significant for HTN, T2DM, HLD, CKD IV .  Patient was referred and last seen by Primary Care Provider, Dr. Laural Benes, on 02/25/23.  At last visit, BP was 161/87. UACR > 300. Patient requested to not cut amlodipine 10 mg in half. New rxn sent for amlodipine 5 mg BID.  Today, patient arrives in *** spirits and presents without *** assistance. *** Denies dizziness, headache, blurred vision, swelling.   Patient reports hypertension was diagnosed in ***.   Family/Social history:  - Fhx: mother- stroke -Tobacco: never smoker  Medication adherence *** . Patient has *** taken BP medications today.   Current antihypertensives include: amlodipine 5 mg BID, hydralazine 100 mg TID, isosorbide dinitrate 20 mg TID, labetalol 200 mg TID, furosemide 40 mg BID  Antihypertensives tried in the past include:lisinopril 20 mg BID, clonidine 0.1 mg BID  Reported home BP readings: ***  Patient reported dietary habits: Eats *** meals/day Breakfast: *** Lunch: *** Dinner: *** Snacks: *** Drinks: ***  Patient-reported exercise habits: ***  ASCVD risk factors include: ***  O:  ROS  Physical Exam  Last 3 Office BP readings: BP Readings from Last 3 Encounters:  02/25/23 (!) 161/87  07/09/22 (!) 153/89  04/03/22 (!) 150/80    BMET    Component Value Date/Time   NA 142 02/25/2023 1225   K 3.7 02/25/2023 1225   CL 104 02/25/2023 1225   CO2 22 02/25/2023 1225   GLUCOSE 110 (H) 02/25/2023 1225   GLUCOSE 141 (H) 04/19/2021 2214   BUN 25 02/25/2023 1225   CREATININE 1.98 (H) 02/25/2023 1225   CALCIUM 9.8 02/25/2023 1225   CALCIUM 9.8 08/22/2008 2256   GFRNONAA 20 (L) 04/19/2021 2214   GFRAA 30 (L) 08/14/2020 1043    Renal function: CrCl cannot be calculated (Patient's most recent lab result is older than the maximum 21 days allowed.).  Clinical ASCVD:  {YES/NO:21197} The ASCVD Risk score (Arnett DK, et al., 2019) failed to calculate for the following reasons:   The valid HDL cholesterol range is 20 to 100 mg/dL  Patient is participating in a Managed Medicaid Plan:  {MM YES/NO:27447::"Yes"}    A/P: Hypertension diagnosed *** currently *** on current medications. BP goal < 130/80 *** mmHg. Medication adherence appears ***. Control is suboptimal due to ***.  -{Meds adjust:18428} ***.  -{Meds adjust:18428} ***.  -Patient educated on purpose, proper use, and potential adverse effects of ***.  -F/u labs ordered - *** -Counseled on lifestyle modifications for blood pressure control including reduced dietary sodium, increased exercise, adequate sleep. -Encouraged patient to check BP at home and bring log of readings to next visit. Counseled on proper use of home BP cuff.   Results reviewed and written information provided.    Written patient instructions provided. Patient verbalized understanding of treatment plan.  Total time in face to face counseling *** minutes.    Follow-up:  Pharmacist ***. PCP clinic visit in ***.  Patient seen with ***

## 2023-03-31 ENCOUNTER — Other Ambulatory Visit: Payer: Self-pay

## 2023-04-01 ENCOUNTER — Other Ambulatory Visit: Payer: Self-pay

## 2023-04-01 ENCOUNTER — Ambulatory Visit: Payer: Self-pay | Admitting: Pharmacist

## 2023-04-02 ENCOUNTER — Other Ambulatory Visit: Payer: Self-pay

## 2023-04-03 ENCOUNTER — Ambulatory Visit: Payer: Self-pay

## 2023-04-04 ENCOUNTER — Ambulatory Visit: Payer: Self-pay | Admitting: Pharmacist

## 2023-04-08 ENCOUNTER — Ambulatory Visit: Payer: Self-pay | Attending: Internal Medicine

## 2023-04-11 ENCOUNTER — Other Ambulatory Visit: Payer: Self-pay

## 2023-05-19 ENCOUNTER — Other Ambulatory Visit (HOSPITAL_COMMUNITY): Payer: Self-pay

## 2023-05-19 MED ORDER — LISINOPRIL 2.5 MG PO TABS
2.5000 mg | ORAL_TABLET | Freq: Every day | ORAL | 3 refills | Status: DC
  Filled 2023-05-19 – 2023-05-23 (×2): qty 30, 30d supply, fill #0
  Filled 2023-06-24: qty 30, 30d supply, fill #1
  Filled 2023-06-26 – 2023-12-23 (×2): qty 30, 30d supply, fill #0
  Filled 2024-02-03: qty 30, 30d supply, fill #1

## 2023-05-21 ENCOUNTER — Other Ambulatory Visit: Payer: Self-pay | Admitting: Internal Medicine

## 2023-05-21 NOTE — Telephone Encounter (Signed)
Medication Refill - Medication: lisinopril (ZESTRIL) 2.5 MG tablet [[id number]]   Has the patient contacted their pharmacy? Yes.    (Agent: If yes, when and what did the pharmacy advise?) Contact PCP   Preferred Pharmacy (with phone number or street name): [place] [facility]  [address] [place] [address]   Has the patient been seen for an appointment in the last year OR does the patient have an upcoming appointment? Yes.    Agent: Please be advised that RX refills may take up to 3 business days. We ask that you follow-up with your pharmacy.

## 2023-05-22 NOTE — Telephone Encounter (Signed)
Unable to refill per protocol, Rx request was refilled 05/19/23, duplicate request.  Requested Prescriptions  Pending Prescriptions Disp Refills   lisinopril (ZESTRIL) 2.5 MG tablet 30 tablet 3    Sig: Take 1 tablet (2.5 mg total) by mouth daily.     Cardiovascular:  ACE Inhibitors Failed - 05/21/2023  1:23 PM      Failed - Cr in normal range and within 180 days    Creatinine, Ser  Date Value Ref Range Status  02/25/2023 1.98 (H) 0.57 - 1.00 mg/dL Final   Creatinine, Urine  Date Value Ref Range Status  09/29/2007 126.9  Final         Failed - Last BP in normal range    BP Readings from Last 1 Encounters:  02/25/23 (!) 161/87         Passed - K in normal range and within 180 days    Potassium  Date Value Ref Range Status  02/25/2023 3.7 3.5 - 5.2 mmol/L Final         Passed - Patient is not pregnant      Passed - Valid encounter within last 6 months    Recent Outpatient Visits           2 months ago Type 2 diabetes mellitus with morbid obesity (HCC)   [place] [facility] Jonah Blue B, MD   10 months ago Type 2 diabetes mellitus with morbid obesity [facility])   [place] [facility] Jonah Blue B, MD   1 year ago Hypertensive kidney disease with stage 4 chronic kidney disease [facility])   [place] [facility] [place], [place], [place]   1 year ago Type 2 diabetes mellitus with morbid obesity [facility])   [place] [facility] Marcine Matar, MD   1 year ago Appointment canceled by hospital   [facility] Marcine Matar, MD       Future Appointments             In 1 month Laural Benes, Binnie Rail, MD [facility]

## 2023-05-23 ENCOUNTER — Other Ambulatory Visit: Payer: Self-pay

## 2023-06-18 ENCOUNTER — Other Ambulatory Visit (HOSPITAL_COMMUNITY): Payer: Self-pay

## 2023-06-18 ENCOUNTER — Other Ambulatory Visit: Payer: Self-pay

## 2023-06-18 MED ORDER — LISINOPRIL 2.5 MG PO TABS
2.5000 mg | ORAL_TABLET | Freq: Every day | ORAL | 5 refills | Status: DC
  Filled 2023-06-18 (×2): qty 30, 30d supply, fill #0
  Filled 2023-07-30: qty 30, 30d supply, fill #1
  Filled 2023-08-28 (×2): qty 30, 30d supply, fill #2
  Filled 2023-09-26: qty 30, 30d supply, fill #3
  Filled 2023-10-28: qty 30, 30d supply, fill #4

## 2023-06-20 ENCOUNTER — Other Ambulatory Visit: Payer: Self-pay

## 2023-06-24 ENCOUNTER — Other Ambulatory Visit (HOSPITAL_COMMUNITY): Payer: Self-pay

## 2023-06-24 ENCOUNTER — Other Ambulatory Visit: Payer: Self-pay

## 2023-06-26 ENCOUNTER — Other Ambulatory Visit: Payer: Self-pay

## 2023-06-26 ENCOUNTER — Other Ambulatory Visit (HOSPITAL_COMMUNITY): Payer: Self-pay

## 2023-07-08 ENCOUNTER — Ambulatory Visit: Payer: Self-pay | Admitting: Internal Medicine

## 2023-07-31 ENCOUNTER — Other Ambulatory Visit: Payer: Self-pay

## 2023-08-01 ENCOUNTER — Other Ambulatory Visit: Payer: Self-pay

## 2023-08-06 ENCOUNTER — Other Ambulatory Visit: Payer: Self-pay

## 2023-08-28 ENCOUNTER — Other Ambulatory Visit: Payer: Self-pay

## 2023-08-29 ENCOUNTER — Other Ambulatory Visit: Payer: Self-pay

## 2023-09-01 ENCOUNTER — Ambulatory Visit: Payer: Self-pay | Admitting: Internal Medicine

## 2023-09-14 ENCOUNTER — Other Ambulatory Visit: Payer: Self-pay

## 2023-09-15 ENCOUNTER — Other Ambulatory Visit: Payer: Self-pay

## 2023-09-15 MED ORDER — ISOSORBIDE DINITRATE 20 MG PO TABS
20.0000 mg | ORAL_TABLET | Freq: Three times a day (TID) | ORAL | 3 refills | Status: DC
  Filled 2023-09-15: qty 90, 30d supply, fill #0
  Filled 2023-10-20: qty 90, 30d supply, fill #1
  Filled 2023-11-24: qty 90, 30d supply, fill #2
  Filled 2023-12-23: qty 90, 30d supply, fill #3
  Filled 2024-02-03: qty 90, 30d supply, fill #4

## 2023-09-16 ENCOUNTER — Other Ambulatory Visit: Payer: Self-pay

## 2023-09-18 ENCOUNTER — Other Ambulatory Visit: Payer: Self-pay

## 2023-09-26 ENCOUNTER — Other Ambulatory Visit: Payer: Self-pay

## 2023-10-20 ENCOUNTER — Other Ambulatory Visit: Payer: Self-pay

## 2023-10-20 ENCOUNTER — Other Ambulatory Visit: Payer: Self-pay | Admitting: Internal Medicine

## 2023-10-20 MED ORDER — LABETALOL HCL 200 MG PO TABS
200.0000 mg | ORAL_TABLET | Freq: Three times a day (TID) | ORAL | 3 refills | Status: DC
  Filled 2023-10-20: qty 90, 30d supply, fill #0
  Filled 2023-11-24: qty 90, 30d supply, fill #1
  Filled 2023-12-23: qty 90, 30d supply, fill #2
  Filled 2024-02-03: qty 90, 30d supply, fill #3

## 2023-10-20 MED ORDER — HYDRALAZINE HCL 100 MG PO TABS
100.0000 mg | ORAL_TABLET | Freq: Three times a day (TID) | ORAL | 3 refills | Status: DC
  Filled 2023-10-20: qty 90, 30d supply, fill #0
  Filled 2023-11-24: qty 90, 30d supply, fill #1
  Filled 2023-12-23: qty 90, 30d supply, fill #2
  Filled 2024-02-03: qty 90, 30d supply, fill #3

## 2023-10-28 ENCOUNTER — Other Ambulatory Visit: Payer: Self-pay | Admitting: Internal Medicine

## 2023-10-28 ENCOUNTER — Other Ambulatory Visit: Payer: Self-pay | Admitting: Pharmacist

## 2023-10-28 ENCOUNTER — Other Ambulatory Visit: Payer: Self-pay

## 2023-10-28 DIAGNOSIS — I129 Hypertensive chronic kidney disease with stage 1 through stage 4 chronic kidney disease, or unspecified chronic kidney disease: Secondary | ICD-10-CM

## 2023-10-28 DIAGNOSIS — E782 Mixed hyperlipidemia: Secondary | ICD-10-CM

## 2023-10-28 MED ORDER — FUROSEMIDE 40 MG PO TABS
40.0000 mg | ORAL_TABLET | Freq: Two times a day (BID) | ORAL | 0 refills | Status: DC
  Filled 2023-10-28: qty 180, 90d supply, fill #0

## 2023-10-28 MED ORDER — AMLODIPINE BESYLATE 5 MG PO TABS
5.0000 mg | ORAL_TABLET | Freq: Two times a day (BID) | ORAL | 0 refills | Status: DC
  Filled 2023-10-28: qty 180, 90d supply, fill #0

## 2023-10-28 MED ORDER — ATORVASTATIN CALCIUM 40 MG PO TABS
40.0000 mg | ORAL_TABLET | Freq: Every day | ORAL | 0 refills | Status: DC
  Filled 2023-12-23: qty 90, 90d supply, fill #0

## 2023-10-28 NOTE — Telephone Encounter (Signed)
Requested medication (s) are due for refill today - unsure  Requested medication (s) are on the active medication list -yes  Future visit scheduled -yes  Last refill: 06/18/23 and 05/19/23  Notes to clinic: outside provider- sent for review   Requested Prescriptions  Pending Prescriptions Disp Refills   lisinopril (ZESTRIL) 2.5 MG tablet 30 tablet 5    Sig: Take 1 tablet (2.5 mg total) by mouth daily.     Cardiovascular:  ACE Inhibitors Failed - 10/28/2023  2:59 PM      Failed - Cr in normal range and within 180 days    Creatinine, Ser  Date Value Ref Range Status  02/25/2023 1.98 (H) 0.57 - 1.00 mg/dL Final   Creatinine, Urine  Date Value Ref Range Status  09/29/2007 126.9  Final         Failed - K in normal range and within 180 days    Potassium  Date Value Ref Range Status  02/25/2023 3.7 3.5 - 5.2 mmol/L Final         Failed - Last BP in normal range    BP Readings from Last 1 Encounters:  02/25/23 (!) 161/87         Failed - Valid encounter within last 6 months    Recent Outpatient Visits           8 months ago Type 2 diabetes mellitus with morbid obesity (HCC)   [place] [facility] - A Dept Of [place]. [facility] Jonah Blue B, MD   1 year ago Type 2 diabetes mellitus with morbid obesity [facility])   [place] [facility] - A Dept Of [place]. [facility] Marcine Matar, MD   1 year ago Hypertensive kidney disease with stage 4 chronic kidney disease [facility])   [place] [facility] - A Dept Of [place]. [facility], [place], [place]   2 years ago Type 2 diabetes mellitus with morbid obesity [facility])   [place] [facility] - A Dept Of [place]. [facility] Marcine Matar, MD   2 years ago Appointment canceled by hospital   [place] [facility] [place] - A Dept Of . [facility] Marcine Matar, MD       Future Appointments             In 2  months Laural Benes Binnie Rail, MD [facility] [place] - A Dept Of Eligha Bridegroom. [facility]            Passed - Patient is not pregnant      Signed Prescriptions Disp Refills   atorvastatin (LIPITOR) 40 MG tablet 90 tablet 0    Sig: Take 1 tablet (40 mg total) by mouth daily.     Cardiovascular:  Antilipid - Statins Failed - 10/28/2023  2:59 PM      Failed - Lipid Panel in normal range within the last 12 months    Cholesterol, Total  Date Value Ref Range Status  02/25/2023 187 100 - 199 mg/dL Final   LDL Chol Calc (NIH)  Date Value Ref Range Status  02/25/2023 73 0 - 99 mg/dL Final   Direct LDL  Date Value Ref Range Status  05/07/2010 108 (H) mg/dL Final    Comment:    See lab report for associated comment(s)   HDL  Date Value Ref Range Status  02/25/2023 102 >39 mg/dL Final  Triglycerides  Date Value Ref Range Status  02/25/2023 61 0 - 149 mg/dL Final         Passed - Patient is not pregnant      Passed - Valid encounter within last 12 months    Recent Outpatient Visits           8 months ago Type 2 diabetes mellitus with morbid obesity (HCC)   [place] [facility] - A Dept Of [place]. [facility] Jonah Blue B, MD   1 year ago Type 2 diabetes mellitus with morbid obesity [facility])   [place] [facility] - A Dept Of [place]. [facility] Marcine Matar, MD   1 year ago Hypertensive kidney disease with stage 4 chronic kidney disease [facility])   [place] [facility] - A Dept Of [place]. [facility] [place], [place], [place]   2 years ago Type 2 diabetes mellitus with morbid obesity [facility])   [place] [facility] - A Dept Of [place]. [facility] Marcine Matar, MD   2 years ago Appointment canceled by hospital   [place] [facility] [place] - A Dept Of [place]. [facility] Marcine Matar, MD       Future Appointments             In 2  months Laural Benes Binnie Rail, MD [facility] [place] - A Dept Of Eligha Bridegroom. [facility]               Requested Prescriptions  Pending Prescriptions Disp Refills   lisinopril (ZESTRIL) 2.5 MG tablet 30 tablet 5    Sig: Take 1 tablet (2.5 mg total) by mouth daily.     Cardiovascular:  ACE Inhibitors Failed - 10/28/2023  2:59 PM      Failed - Cr in normal range and within 180 days    Creatinine, Ser  Date Value Ref Range Status  02/25/2023 1.98 (H) 0.57 - 1.00 mg/dL Final   Creatinine, Urine  Date Value Ref Range Status  09/29/2007 126.9  Final         Failed - K in normal range and within 180 days    Potassium  Date Value Ref Range Status  02/25/2023 3.7 3.5 - 5.2 mmol/L Final         Failed - Last BP in normal range    BP Readings from Last 1 Encounters:  02/25/23 (!) 161/87         Failed - Valid encounter within last 6 months    Recent Outpatient Visits           8 months ago Type 2 diabetes mellitus with morbid obesity (HCC)   [place] [facility] - A Dept Of [place]. [facility] Jonah Blue B, MD   1 year ago Type 2 diabetes mellitus with morbid obesity [facility])   [place] [facility] - A Dept Of [place]. [facility] Marcine Matar, MD   1 year ago Hypertensive kidney disease with stage 4 chronic kidney disease [facility])   [place] [facility] - A Dept Of [place]. [facility], [place], [place]   2 years ago Type 2 diabetes mellitus with morbid obesity [facility])    Comm [facility] - A Dept Of [place]. [facility] Marcine Matar, MD   2  years ago Appointment canceled by hospital   [place] [facility] - A Dept Of [place]. [facility] Marcine Matar, MD       Future Appointments             In 2 months Laural Benes Binnie Rail, MD [facility] [place] - A Dept Of Eligha Bridegroom. [facility]             Passed - Patient is not pregnant      Signed Prescriptions Disp Refills   atorvastatin (LIPITOR) 40 MG tablet 90 tablet 0    Sig: Take 1 tablet (40 mg total) by mouth daily.     Cardiovascular:  Antilipid - Statins Failed - 10/28/2023  2:59 PM      Failed - Lipid Panel in normal range within the last 12 months    Cholesterol, Total  Date Value Ref Range Status  02/25/2023 187 100 - 199 mg/dL Final   LDL Chol Calc (NIH)  Date Value Ref Range Status  02/25/2023 73 0 - 99 mg/dL Final   Direct LDL  Date Value Ref Range Status  05/07/2010 108 (H) mg/dL Final    Comment:    See lab report for associated comment(s)   HDL  Date Value Ref Range Status  02/25/2023 102 >39 mg/dL Final   Triglycerides  Date Value Ref Range Status  02/25/2023 61 0 - 149 mg/dL Final         Passed - Patient is not pregnant      Passed - Valid encounter within last 12 months    Recent Outpatient Visits           8 months ago Type 2 diabetes mellitus with morbid obesity (HCC)   [place] [facility] - A Dept Of [place]. [facility] Jonah Blue B, MD   1 year ago Type 2 diabetes mellitus with morbid obesity [facility])   [place] [facility] - A Dept Of [place]. [facility] Marcine Matar, MD   1 year ago Hypertensive kidney disease with stage 4 chronic kidney disease [facility])   [place] [facility] - A Dept Of [place]. [facility] [place], [place], [place]   2 years ago Type 2 diabetes mellitus with morbid obesity [facility])   [place] [facility] - A Dept Of [place]. [facility] Marcine Matar, MD   2 years ago Appointment canceled by hospital   [place] [facility] [place] - A Dept Of [place]. [facility] Marcine Matar, MD       Future Appointments             In 2 months Laural Benes Binnie Rail, MD [facility] [place] - A Dept Of Eligha Bridegroom. [facility]

## 2023-10-28 NOTE — Telephone Encounter (Signed)
Little early- but labs not due until 5/25 prescription for Requested Prescriptions  Pending Prescriptions Disp Refills   atorvastatin (LIPITOR) 40 MG tablet 90 tablet 2    Sig: Take 1 tablet (40 mg total) by mouth daily.     Cardiovascular:  Antilipid - Statins Failed - 10/28/2023  2:58 PM      Failed - Lipid Panel in normal range within the last 12 months    Cholesterol, Total  Date Value Ref Range Status  02/25/2023 187 100 - 199 mg/dL Final   LDL Chol Calc (NIH)  Date Value Ref Range Status  02/25/2023 73 0 - 99 mg/dL Final   Direct LDL  Date Value Ref Range Status  05/07/2010 108 (H) mg/dL Final    Comment:    See lab report for associated comment(s)   HDL  Date Value Ref Range Status  02/25/2023 102 >39 mg/dL Final   Triglycerides  Date Value Ref Range Status  02/25/2023 61 0 - 149 mg/dL Final         Passed - Patient is not pregnant      Passed - Valid encounter within last 12 months    Recent Outpatient Visits           8 months ago Type 2 diabetes mellitus with morbid obesity (HCC)   [place] [facility] - A Dept Of [place]. [facility] Jonah Blue B, MD   1 year ago Type 2 diabetes mellitus with morbid obesity [facility])   [place] [facility] - A Dept Of [place]. [facility] Marcine Matar, MD   1 year ago Hypertensive kidney disease with stage 4 chronic kidney disease [facility])   [place] [facility] - A Dept Of [place]. [facility], [place], [place]   2 years ago Type 2 diabetes mellitus with morbid obesity [facility])   [place] [facility] - A Dept Of [place]. [facility] Marcine Matar, MD   2 years ago Appointment canceled by hospital    [facility] [place] - A Dept Of [place]. [facility] Marcine Matar, MD       Future Appointments             In 2 months Laural Benes Binnie Rail, MD [facility] [place] - A Dept Of Eligha Bridegroom. [facility]             lisinopril (ZESTRIL) 2.5 MG tablet 30 tablet 5    Sig: Take 1 tablet (2.5 mg total) by mouth daily.     Cardiovascular:  ACE Inhibitors Failed - 10/28/2023  2:58 PM      Failed - Cr in normal range and within 180 days    Creatinine, Ser  Date Value Ref Range Status  02/25/2023 1.98 (H) 0.57 - 1.00 mg/dL Final   Creatinine, Urine  Date Value Ref Range Status  09/29/2007 126.9  Final         Failed - K in normal range and within 180 days    Potassium  Date Value Ref Range Status  02/25/2023 3.7 3.5 - 5.2 mmol/L Final         Failed - Last BP in normal range    BP Readings from Last 1 Encounters:  02/25/23 (!) 161/87         Failed - Valid encounter within last 6 months    Recent Outpatient  Visits           8 months ago Type 2 diabetes mellitus with morbid obesity (HCC)   [place] [facility] - A Dept Of [place]. [facility] Jonah Blue B, MD   1 year ago Type 2 diabetes mellitus with morbid obesity [facility])   [place] [facility] - A Dept Of [place]. [facility] Marcine Matar, MD   1 year ago Hypertensive kidney disease with stage 4 chronic kidney disease [facility])   [place] [facility] - A Dept Of [place]. [facility] [place], [place], [place]   2 years ago Type 2 diabetes mellitus with morbid obesity [facility])   [place] [facility] - A Dept Of [place]. [facility] Marcine Matar, MD   2 years ago Appointment canceled by hospital   [place] [facility] [place] - A Dept Of . [facility] Marcine Matar, MD       Future Appointments             In 2 months Laural Benes Binnie Rail, MD [facility] [place] - A Dept Of Eligha Bridegroom. [facility]            Passed - Patient is not pregnant

## 2023-10-28 NOTE — Telephone Encounter (Signed)
Medication Refill -  Most Recent Primary Care Visit:  Provider: [facility] FINANCIAL COUNSELOR  Department: [facility]  Visit Type: FINANCIAL COUNSELING  Date: 04/08/2023  Medication:  amLODipine (NORVASC) 5 MG tablet atorvastatin (LIPITOR) 40 MG tablet furosemide (LASIX) 40 MG tablet lisinopril (ZESTRIL) 2.5 MG tablet   Has the patient contacted their pharmacy? No (Agent: If no, request that the patient contact the pharmacy for the refill. If patient does not wish to contact the pharmacy document the reason why and proceed with request.) (Agent: If yes, when and what did the pharmacy advise?)  Is this the correct pharmacy for this prescription? Yes If no, delete pharmacy and type the correct one.  This is the patient's preferred pharmacy:  [facility] [address] [place] [address] Phone: [phone] Fax: [phone]  Has the prescription been filled recently? No  Is the patient out of the medication? Yes  Has the patient been seen for an appointment in the last year OR does the patient have an upcoming appointment? Yes  Can we respond through MyChart? Yes  Pt called to cancel her appt.  She thought it was today, but it is next week.  However, pt states her daughter died and was buried last week.  Pt is still very emotional.  Offered pt to speak w/ a nurse, pt declined.  But she did ask if we could please refill her meds and she rescheduled to first available, March 28

## 2023-10-29 ENCOUNTER — Other Ambulatory Visit: Payer: Self-pay

## 2023-10-29 MED ORDER — LISINOPRIL 2.5 MG PO TABS
2.5000 mg | ORAL_TABLET | Freq: Every day | ORAL | 0 refills | Status: DC
  Filled 2023-10-29 – 2023-11-24 (×2): qty 30, 30d supply, fill #0

## 2023-11-04 ENCOUNTER — Ambulatory Visit: Payer: Self-pay | Admitting: Internal Medicine

## 2023-11-24 ENCOUNTER — Other Ambulatory Visit (HOSPITAL_COMMUNITY): Payer: Self-pay

## 2023-11-24 ENCOUNTER — Other Ambulatory Visit: Payer: Self-pay

## 2023-12-23 ENCOUNTER — Other Ambulatory Visit: Payer: Self-pay

## 2023-12-24 ENCOUNTER — Other Ambulatory Visit: Payer: Self-pay

## 2024-01-02 ENCOUNTER — Ambulatory Visit: Payer: Self-pay | Admitting: Internal Medicine

## 2024-02-03 ENCOUNTER — Other Ambulatory Visit: Payer: Self-pay

## 2024-02-03 ENCOUNTER — Other Ambulatory Visit: Payer: Self-pay | Admitting: Internal Medicine

## 2024-02-03 DIAGNOSIS — I129 Hypertensive chronic kidney disease with stage 1 through stage 4 chronic kidney disease, or unspecified chronic kidney disease: Secondary | ICD-10-CM

## 2024-02-04 ENCOUNTER — Other Ambulatory Visit: Payer: Self-pay

## 2024-02-06 ENCOUNTER — Other Ambulatory Visit: Payer: Self-pay

## 2024-02-27 ENCOUNTER — Encounter: Payer: Self-pay | Admitting: Internal Medicine

## 2024-02-27 ENCOUNTER — Other Ambulatory Visit: Payer: Self-pay

## 2024-02-27 ENCOUNTER — Ambulatory Visit: Payer: Self-pay | Attending: Internal Medicine | Admitting: Internal Medicine

## 2024-02-27 DIAGNOSIS — F4320 Adjustment disorder, unspecified: Secondary | ICD-10-CM

## 2024-02-27 DIAGNOSIS — Z1231 Encounter for screening mammogram for malignant neoplasm of breast: Secondary | ICD-10-CM

## 2024-02-27 DIAGNOSIS — R011 Cardiac murmur, unspecified: Secondary | ICD-10-CM

## 2024-02-27 DIAGNOSIS — E1169 Type 2 diabetes mellitus with other specified complication: Secondary | ICD-10-CM

## 2024-02-27 DIAGNOSIS — I129 Hypertensive chronic kidney disease with stage 1 through stage 4 chronic kidney disease, or unspecified chronic kidney disease: Secondary | ICD-10-CM

## 2024-02-27 DIAGNOSIS — E119 Type 2 diabetes mellitus without complications: Secondary | ICD-10-CM

## 2024-02-27 DIAGNOSIS — E785 Hyperlipidemia, unspecified: Secondary | ICD-10-CM

## 2024-02-27 DIAGNOSIS — Z1211 Encounter for screening for malignant neoplasm of colon: Secondary | ICD-10-CM

## 2024-02-27 DIAGNOSIS — N184 Chronic kidney disease, stage 4 (severe): Secondary | ICD-10-CM

## 2024-02-27 DIAGNOSIS — Z6841 Body Mass Index (BMI) 40.0 and over, adult: Secondary | ICD-10-CM

## 2024-02-27 DIAGNOSIS — D631 Anemia in chronic kidney disease: Secondary | ICD-10-CM

## 2024-02-27 LAB — POCT GLYCOSYLATED HEMOGLOBIN (HGB A1C): HbA1c, POC (controlled diabetic range): 6.1 % (ref 0.0–7.0)

## 2024-02-27 LAB — GLUCOSE, POCT (MANUAL RESULT ENTRY): POC Glucose: 147 mg/dL — AB (ref 70–99)

## 2024-02-27 MED ORDER — AMLODIPINE BESYLATE 5 MG PO TABS
5.0000 mg | ORAL_TABLET | Freq: Two times a day (BID) | ORAL | 1 refills | Status: DC
  Filled 2024-02-27: qty 180, 90d supply, fill #0
  Filled 2024-06-18: qty 180, 90d supply, fill #1

## 2024-02-27 MED ORDER — ISOSORBIDE DINITRATE 20 MG PO TABS
20.0000 mg | ORAL_TABLET | Freq: Three times a day (TID) | ORAL | 1 refills | Status: DC
  Filled 2024-02-27: qty 270, 90d supply, fill #0
  Filled 2024-07-02: qty 270, 90d supply, fill #1
  Filled 2024-07-03: qty 90, 30d supply, fill #1
  Filled 2024-07-03: qty 270, 90d supply, fill #1
  Filled 2024-07-03: qty 90, 30d supply, fill #1
  Filled 2024-08-02 – 2024-08-04 (×2): qty 90, 30d supply, fill #2

## 2024-02-27 MED ORDER — LISINOPRIL 2.5 MG PO TABS
2.5000 mg | ORAL_TABLET | Freq: Every day | ORAL | 1 refills | Status: DC
  Filled 2024-02-27 – 2024-03-05 (×2): qty 90, 90d supply, fill #0
  Filled 2024-06-02: qty 90, 90d supply, fill #1

## 2024-02-27 MED ORDER — ATORVASTATIN CALCIUM 40 MG PO TABS
40.0000 mg | ORAL_TABLET | Freq: Every day | ORAL | 1 refills | Status: DC
  Filled 2024-02-27: qty 90, 90d supply, fill #0
  Filled 2024-04-12: qty 30, 30d supply, fill #0
  Filled 2024-05-25 – 2024-05-28 (×2): qty 30, 30d supply, fill #1
  Filled 2024-07-02 – 2024-07-05 (×2): qty 30, 30d supply, fill #2
  Filled 2024-08-02 – 2024-08-04 (×2): qty 30, 30d supply, fill #3

## 2024-02-27 MED ORDER — HYDRALAZINE HCL 100 MG PO TABS
100.0000 mg | ORAL_TABLET | Freq: Three times a day (TID) | ORAL | 1 refills | Status: DC
  Filled 2024-02-27: qty 270, 90d supply, fill #0
  Filled 2024-07-02: qty 270, 90d supply, fill #1
  Filled 2024-07-03: qty 90, 30d supply, fill #1
  Filled 2024-08-02 – 2024-08-04 (×2): qty 90, 30d supply, fill #2

## 2024-02-27 MED ORDER — LABETALOL HCL 200 MG PO TABS
200.0000 mg | ORAL_TABLET | Freq: Three times a day (TID) | ORAL | 1 refills | Status: DC
  Filled 2024-02-27: qty 270, 90d supply, fill #0
  Filled 2024-07-02 – 2024-07-03 (×2): qty 90, 30d supply, fill #1
  Filled 2024-08-02 – 2024-08-04 (×2): qty 90, 30d supply, fill #2

## 2024-02-27 MED ORDER — FUROSEMIDE 20 MG PO TABS
20.0000 mg | ORAL_TABLET | Freq: Every day | ORAL | 1 refills | Status: DC
  Filled 2024-02-27: qty 90, 90d supply, fill #0
  Filled 2024-06-02: qty 90, 90d supply, fill #1

## 2024-02-27 NOTE — Patient Instructions (Signed)
 VISIT SUMMARY:  Today, you came in for a follow-up on your chronic conditions, including diabetes, hypertension, and chronic kidney disease. We discussed your current medications, recent health changes, and emotional well-being.  YOUR PLAN:  -HYPERTENSION: Hypertension means high blood pressure. Your blood pressure readings are slightly above the target range. We have refilled your medications for 90 days and adjusted your furosemide  dosage to 20 mg daily, with instructions to double the dose if swelling increases, then return to 20 mg daily.  -CHRONIC KIDNEY DISEASE WITH ANEMIA: Chronic kidney disease means your kidneys are not working as well as they should, and it is associated with anemia, a condition where you don't have enough red blood cells. We have ordered blood tests to check your kidney function, liver function, and anemia status, and collected a urine sample to check for protein in your urine.  -TYPE 2 DIABETES MELLITUS: Type 2 diabetes means your body does not use insulin properly, leading to high blood sugar levels. Your recent A1c is 6.1%, which is below the target. However, due to your kidney disease and anemia, this result may not be accurate. We may consider using a different test called fructosamine for assessment.  -HYPERLIPIDEMIA: Hyperlipidemia means you have high levels of fats (lipids) in your blood. You are currently taking atorvastatin  to manage this condition.  -HEART MURMUR: A heart murmur is an unusual sound heard between heartbeats. We plan to evaluate this further with an echocardiogram once your insurance coverage is secured. We have applied for Medicaid or Medicare to facilitate this test.  -GENERAL HEALTH MAINTENANCE: You are due for several routine health screenings. We provided a mammogram scholarship and encouraged you to complete it. We also provided a new stool kit for colon cancer screening and scheduled a Pap smear for cervical cancer screening.  -GOALS OF  CARE: You are grieving the loss of your daughter and have declined grief counseling but are aware of the available resources. We offered support and resources for grief counseling if you decide you need them.  INSTRUCTIONS:  Please stop by the lab for blood tests and urine sample collection. Visit the Medicaid office to apply for coverage. Also, apply for the orange card and cone discount card at the front desk.

## 2024-02-27 NOTE — Progress Notes (Signed)
 Patient ID: [name], female    DOB: 15-May-1956  MRN: [id number]  CC: Diabetes (DM f/u. Med refill. [facility] that furosemide  was causing urine accidents /Yes to pap for another appt)   Subjective: [name] is a 68 y.o. female who presents for chronic ds management. Her concerns today include:  Patient with history of DM type 2, HTN, HL, CVA 2009, CKD 3-4, ACD, obesity.    Discussed the use of AI scribe software for clinical note transcription with the patient, who gave verbal consent to proceed.  History of Present Illness [name] is a 68 year old female with diabetes, hypertension, and chronic kidney disease who presents for follow-up on her chronic conditions.  HTN/CKD 4She is on multiple antihypertensive medications, including hydralazine , isosorbide , labetalol , and lisinopril . She has been out of furosemide  for two weeks, initially prescribed for leg swelling. Without it, she experiences less urgency and incontinence. Home blood pressure readings are approximately 130-135/75 mmHg. She denies shortness of breath, chest pain, or leg swelling, and her legs have felt better recently. - Last seen by the nephrologist at [facility] in August of last year.  GFR's have been in the 20s.  She also has chronic anemia associated with CKD.  Last H/H was 11.6/35 done in December 2023.  DM: Her diabetes management includes not checking blood sugars at home, with a recent A1c of 6.1%. She has not been exercising regularly since December due to the passing of her daughter, affecting her routine. She continues atorvastatin  for cholesterol management.  She is coping with the emotional impact of her daughter's passing, which has affected her overall well-being.  She states that she is just now starting to get a hold of herself and trying to be okay.  Reports good support from her spouse and other 2 children.  Daughter had recently graduated from [employer] when she passed in her sleep  in December.  They are awaiting autopsy report.  HM: Mammogram scholarship given on last visit but she did not have it done.  Looks like they did try to contact her unsuccessfully.  FIT kit also given on last visit.  She states that she turned it in but I do not see results.  She is willing to receive another kit today.  Due for urine microalbumin.  Also due for diabetic eye exam but is uninsured.  Patient Active Problem List   Diagnosis Date Noted   Type 2 diabetes mellitus with morbid obesity (HCC) 06/19/2021   Postmenopausal bleeding 12/12/2020   History of abnormal cervical Pap smear 12/12/2020   CKD (chronic kidney disease) stage 4, GFR 15-29 ml/min (HCC) 03/23/2020   LGSIL on Pap smear of cervix 12/29/2019   CKD (chronic kidney disease) stage 3, GFR 30-59 ml/min (HCC) 08/14/2018   Essential hypertension 05/15/2018   Reactive depression 05/15/2018   History of CVA (cerebrovascular accident) without residual deficits 05/15/2018     No current outpatient medications on file prior to visit.   No current facility-administered medications on file prior to visit.    No Known Allergies  Social History   Socioeconomic History   Marital status: Married    Spouse name: Not on file   Number of children: Not on file   Years of education: Not on file   Highest education level: Not on file  Occupational History   Occupation: retired Char[employer] fundraiser  Tobacco Use   Smoking status: Never   Smokeless tobacco: Never  Vaping Use  Vaping status: Never Used  Substance and Sexual Activity   Alcohol use: No   Drug use: Not on file   Sexual activity: Not Currently  Other Topics Concern   Not on file  Social History Narrative   Not on file   Social Drivers of Health   Financial Resource Strain: Not on file  Food Insecurity: Low Risk  (05/19/2023)   Received from [facility]   Hunger Vital Sign    Worried About Running Out of Food in the Last Year: Never true    Ran Out of Food in the Last  Year: Never true  Transportation Needs: Not on file (05/19/2023)  Physical Activity: Not on file  Stress: Not on file  Social Connections: Not on file  Intimate Partner Violence: Not on file    Family History  Problem Relation Age of Onset   Stroke Mother     Past Surgical History:  Procedure Laterality Date   CESAREAN SECTION      ROS: Review of Systems Negative except as stated above  PHYSICAL EXAM: BP (!) 155/79   Pulse 78   Temp 97.9 F (36.6 C) (Oral)   Ht 5\' 1"  (1.549 m)   Wt 224 lb (101.6 kg)   SpO2 97%   BMI 42.32 kg/m   Physical Exam  General appearance - alert, well appearing, and in no distress Mental status - normal mood, behavior, speech, dress, motor activity, and thought processes Neck - supple, no significant adenopathy Chest - clear to auscultation, no wheezes, rales or rhonchi, symmetric air entry Heart -regular rate rhythm, 2-3/6 systolic ejection murmur heard along the left sternal border Extremities -trace bilateral lower extremity edema with 1+ ankle edema      Latest Ref Rng & Units 02/25/2023   12:25 PM 10/25/2021    3:32 PM 04/19/2021   10:14 PM  CMP  Glucose 70 - 99 mg/dL 086  95  578   BUN 8 - 27 mg/dL 25  43  36   Creatinine 0.57 - 1.00 mg/dL 4.69  6.29  5.28   Sodium 134 - 144 mmol/L 142  142  138   Potassium 3.5 - 5.2 mmol/L 3.7  4.5  4.0   Chloride 96 - 106 mmol/L 104  105  106   CO2 20 - 29 mmol/L 22  22  24    Calcium  8.7 - 10.3 mg/dL 9.8  9.8  9.4   Total Protein 6.0 - 8.5 g/dL 7.2  7.4    Total Bilirubin 0.0 - 1.2 mg/dL 0.4  0.3    Alkaline Phos 44 - 121 IU/L 70  65    AST 0 - 40 IU/L 22  25    ALT 0 - 32 IU/L 17  18     Lipid Panel     Component Value Date/Time   CHOL 187 02/25/2023 1225   TRIG 61 02/25/2023 1225   HDL 102 02/25/2023 1225   CHOLHDL 1.8 02/25/2023 1225   CHOLHDL 2.2 Ratio 11/29/2010 2115   VLDL 13 11/29/2010 2115   LDLCALC 73 02/25/2023 1225   LDLDIRECT 108 (H) 05/07/2010 1841    CBC     Component Value Date/Time   WBC 5.9 04/19/2021 2214   RBC 4.00 04/19/2021 2214   HGB 11.9 (L) 04/19/2021 2214   HGB 12.5 11/17/2019 1211   HCT 36.2 04/19/2021 2214   HCT 38.2 11/17/2019 1211   PLT 189 04/19/2021 2214   PLT 183 11/17/2019 1211   MCV 90.5  04/19/2021 2214   MCV 89 11/17/2019 1211   MCH 29.8 04/19/2021 2214   MCHC 32.9 04/19/2021 2214   RDW 13.4 04/19/2021 2214   RDW 13.2 11/17/2019 1211   LYMPHSABS 1.7 01/06/2014 1802   MONOABS 0.4 01/06/2014 1802   EOSABS 0.2 01/06/2014 1802   BASOSABS 0.0 01/06/2014 1802    ASSESSMENT AND PLAN: 1. Type 2 diabetes mellitus with morbid obesity (HCC) (Primary) Diet controlled. Patient states she plans to get back on track with walking on her treadmill several days a week.  Encourage healthy eating habits. - POCT glycosylated hemoglobin (Hb A1C) - POCT glucose (manual entry) - CBC - Comprehensive metabolic panel with GFR - Lipid panel - Microalbumin / creatinine urine ratio  2. Hypertensive kidney disease with stage 4 chronic kidney disease (HCC) Not at goal.  She has been out of furosemide  for about 2 weeks.  She does not like the fact that furosemide  causes urgency leading to incontinence of urine.  Her nephrologist added low-dose of lisinopril  2.5 mg once a day when she was seen back in August.  At this time we decided to restart the furosemide  but at a lower dose of 20 mg daily with the option of increasing to 2 tabs daily whenever she has increased lower extremity edema.  Otherwise she will continue her other medications including amlodipine  5 mg twice a day, labetalol  200 mg 3 times a day, hydralazine  100 mg 3 times a day, isosorbide  20 mg 3 times a day - amLODipine  (NORVASC ) 5 MG tablet; Take 1 tablet (5 mg total) by mouth 2 (two) times daily.  Dispense: 180 tablet; Refill: 1 - labetalol  (NORMODYNE ) 200 MG tablet; Take 1 tablet (200 mg total) by mouth 3 (three) times daily.  Dispense: 270 tablet; Refill: 1 - lisinopril   (ZESTRIL ) 2.5 MG tablet; Take 1 tablet (2.5 mg total) by mouth daily. Must keep appointment in March for additional refill  Dispense: 90 tablet; Refill: 1 - hydrALAZINE  (APRESOLINE ) 100 MG tablet; Take 1 tablet (100 mg total) by mouth 3 (three) times daily.  Dispense: 270 tablet; Refill: 1 - isosorbide  dinitrate (ISORDIL ) 20 MG tablet; Take 1 tablet (20 mg total) by mouth 3 (three) times daily.  Dispense: 270 tablet; Refill: 1 - furosemide  (LASIX ) 20 MG tablet; Take 1 tablet (20 mg total) by mouth daily.  Dispense: 90 tablet; Refill: 1  3. Grief reaction I extended my condolences to her.  Informed her of hospice grief shared group that she feels that she does not need it at this time.  If she changes her mind she will let me know.  4. Hyperlipidemia associated with type 2 diabetes mellitus (HCC) Continue atorvastatin .  Refill given. - atorvastatin  (LIPITOR) 40 MG tablet; Take 1 tablet (40 mg total) by mouth daily.  Dispense: 90 tablet; Refill: 1  5. Anemia in stage 4 chronic kidney disease (HCC) Recheck CBC today.  6. Encounter for screening mammogram for malignant neoplasm of breast Mammogram scholarship given.  Encouraged her to get it done. - MM 3D SCREENING MAMMOGRAM BILATERAL BREAST; Future  7. Screening for colon cancer - Fecal occult blood, imunochemical([facility])  8.  Heart murmur I would like to get an echocardiogram once she gets some type of coverage.  Encouraged her to apply for Medicaid.  Advised that there is a Medicaid office in this building on the fourth floor, she can stop in today before she leaves.  Otherwise she should also apply for orange card/cone discount card and let me know if  she is approved.  Patient was given the opportunity to ask questions.  Patient verbalized understanding of the plan and was able to repeat key elements of the plan.   This documentation was completed using Paediatric nurse.  Any transcriptional errors are  unintentional.  Orders Placed This Encounter  Procedures   Fecal occult blood, imunochemical([facility])   MM 3D SCREENING MAMMOGRAM BILATERAL BREAST   CBC   Comprehensive metabolic panel with GFR   Lipid panel   Microalbumin / creatinine urine ratio   POCT glycosylated hemoglobin (Hb A1C)   POCT glucose (manual entry)     Requested Prescriptions   Signed Prescriptions Disp Refills   atorvastatin  (LIPITOR) 40 MG tablet 90 tablet 1    Sig: Take 1 tablet (40 mg total) by mouth daily.   amLODipine  (NORVASC ) 5 MG tablet 180 tablet 1    Sig: Take 1 tablet (5 mg total) by mouth 2 (two) times daily.   labetalol  (NORMODYNE ) 200 MG tablet 270 tablet 1    Sig: Take 1 tablet (200 mg total) by mouth 3 (three) times daily.   lisinopril  (ZESTRIL ) 2.5 MG tablet 90 tablet 1    Sig: Take 1 tablet (2.5 mg total) by mouth daily. Must keep appointment in March for additional refill   hydrALAZINE  (APRESOLINE ) 100 MG tablet 270 tablet 1    Sig: Take 1 tablet (100 mg total) by mouth 3 (three) times daily.   isosorbide  dinitrate (ISORDIL ) 20 MG tablet 270 tablet 1    Sig: Take 1 tablet (20 mg total) by mouth 3 (three) times daily.   furosemide  (LASIX ) 20 MG tablet 90 tablet 1    Sig: Take 1 tablet (20 mg total) by mouth daily.    Return in about 6 weeks (around 04/09/2024) for PAP.  Concetta Dee, MD, FACP

## 2024-02-28 ENCOUNTER — Ambulatory Visit: Payer: Self-pay | Admitting: Internal Medicine

## 2024-02-28 LAB — COMPREHENSIVE METABOLIC PANEL WITH GFR
ALT: 12 IU/L (ref 0–32)
AST: 18 IU/L (ref 0–40)
Albumin: 4.4 g/dL (ref 3.9–4.9)
Alkaline Phosphatase: 64 IU/L (ref 44–121)
BUN/Creatinine Ratio: 16 (ref 12–28)
BUN: 30 mg/dL — ABNORMAL HIGH (ref 8–27)
Bilirubin Total: 0.3 mg/dL (ref 0.0–1.2)
CO2: 17 mmol/L — ABNORMAL LOW (ref 20–29)
Calcium: 10 mg/dL (ref 8.7–10.3)
Chloride: 108 mmol/L — ABNORMAL HIGH (ref 96–106)
Creatinine, Ser: 1.86 mg/dL — ABNORMAL HIGH (ref 0.57–1.00)
Globulin, Total: 2.7 g/dL (ref 1.5–4.5)
Glucose: 122 mg/dL — ABNORMAL HIGH (ref 70–99)
Potassium: 4.4 mmol/L (ref 3.5–5.2)
Sodium: 142 mmol/L (ref 134–144)
Total Protein: 7.1 g/dL (ref 6.0–8.5)
eGFR: 30 mL/min/{1.73_m2} — ABNORMAL LOW (ref >59)

## 2024-02-28 LAB — LIPID PANEL
Chol/HDL Ratio: 2 ratio (ref 0.0–4.4)
Cholesterol, Total: 180 mg/dL (ref 100–199)
HDL: 89 mg/dL (ref >39)
LDL Chol Calc (NIH): 80 mg/dL (ref 0–99)
Triglycerides: 59 mg/dL (ref 0–149)
VLDL Cholesterol Cal: 11 mg/dL (ref 5–40)

## 2024-02-28 LAB — CBC
Hematocrit: 35.1 % (ref 34.0–46.6)
Hemoglobin: 11.2 g/dL (ref 11.1–15.9)
MCH: 29.6 pg (ref 26.6–33.0)
MCHC: 31.9 g/dL (ref 31.5–35.7)
MCV: 93 fL (ref 79–97)
Platelets: 196 10*3/uL (ref 150–450)
RBC: 3.79 x10E6/uL (ref 3.77–5.28)
RDW: 12.7 % (ref 11.7–15.4)
WBC: 4.9 10*3/uL (ref 3.4–10.8)

## 2024-02-28 LAB — FECAL OCCULT BLOOD, IMMUNOCHEMICAL: Fecal Occult Bld: NEGATIVE

## 2024-02-29 LAB — MICROALBUMIN / CREATININE URINE RATIO
Creatinine, Urine: 101.4 mg/dL
Microalb/Creat Ratio: 237 mg/g{creat} — ABNORMAL HIGH (ref 0–29)
Microalbumin, Urine: 239.9 ug/mL

## 2024-03-02 ENCOUNTER — Telehealth: Payer: Self-pay

## 2024-03-02 NOTE — Telephone Encounter (Signed)
 Telephoned patient at mobile number. No answer, no voice mail option at this time. [facility]CP

## 2024-03-05 ENCOUNTER — Other Ambulatory Visit: Payer: Self-pay

## 2024-04-12 ENCOUNTER — Other Ambulatory Visit: Payer: Self-pay

## 2024-04-16 ENCOUNTER — Other Ambulatory Visit: Payer: Self-pay

## 2024-05-25 ENCOUNTER — Other Ambulatory Visit (HOSPITAL_COMMUNITY): Payer: Self-pay

## 2024-05-27 ENCOUNTER — Other Ambulatory Visit: Payer: Self-pay

## 2024-05-28 ENCOUNTER — Other Ambulatory Visit: Payer: Self-pay

## 2024-05-28 ENCOUNTER — Other Ambulatory Visit (HOSPITAL_COMMUNITY): Payer: Self-pay

## 2024-06-02 ENCOUNTER — Other Ambulatory Visit: Payer: Self-pay

## 2024-06-08 ENCOUNTER — Other Ambulatory Visit: Payer: Self-pay

## 2024-06-18 ENCOUNTER — Other Ambulatory Visit: Payer: Self-pay

## 2024-07-02 ENCOUNTER — Other Ambulatory Visit: Payer: Self-pay

## 2024-07-03 ENCOUNTER — Encounter (HOSPITAL_COMMUNITY): Payer: Self-pay

## 2024-07-03 ENCOUNTER — Ambulatory Visit (HOSPITAL_COMMUNITY)
Admission: EM | Admit: 2024-07-03 | Discharge: 2024-07-03 | Disposition: A | Discharge status: Home / Self Care | Payer: Self-pay

## 2024-07-03 ENCOUNTER — Other Ambulatory Visit (HOSPITAL_COMMUNITY): Payer: Self-pay

## 2024-07-03 ENCOUNTER — Other Ambulatory Visit (HOSPITAL_BASED_OUTPATIENT_CLINIC_OR_DEPARTMENT_OTHER): Payer: Self-pay

## 2024-07-03 DIAGNOSIS — I129 Hypertensive chronic kidney disease with stage 1 through stage 4 chronic kidney disease, or unspecified chronic kidney disease: Secondary | ICD-10-CM

## 2024-07-03 DIAGNOSIS — Z760 Encounter for issue of repeat prescription: Secondary | ICD-10-CM

## 2024-07-03 NOTE — ED Provider Notes (Signed)
 [facility]    CSN: [id number] Arrival date & time: 07/03/24  1053      History   Chief Complaint Chief Complaint  Patient presents with   Medication Refill    HPI [name] is a 68 y.o. female.   Patient presents requesting a few days of her hydralazine , labetalol , and isosorbide .  Patient states that she went to pick up her prescriptions yesterday, but arrived at the pharmacy too late and was unable to pick them up.  Patient normally gets her prescriptions at the [facility] health community pharmacy at [employer].  I spoke with pharmacy staff at [facility] health community pharmacy at [facility] and they were able to transfer these prescription refills over to their location for her to get them filled today.  The history is provided by the patient and medical records.  Medication Refill   Past Medical History:  Diagnosis Date   Hypertension     Patient Active Problem List   Diagnosis Date Noted   Type 2 diabetes mellitus with morbid obesity (HCC) 06/19/2021   Postmenopausal bleeding 12/12/2020   History of abnormal cervical Pap smear 12/12/2020   CKD (chronic kidney disease) stage 4, GFR 15-29 ml/min (HCC) 03/23/2020   LGSIL on Pap smear of cervix 12/29/2019   CKD (chronic kidney disease) stage 3, GFR 30-59 ml/min (HCC) 08/14/2018   Essential hypertension 05/15/2018   Reactive depression 05/15/2018   History of CVA (cerebrovascular accident) without residual deficits 05/15/2018    Past Surgical History:  Procedure Laterality Date   CESAREAN SECTION      OB History   No obstetric history on file.      Home Medications    Prior to Admission medications   Medication Sig Start Date End Date Taking? Authorizing Provider  hydrALAZINE  (APRESOLINE ) 100 MG tablet Take 1 tablet (100 mg total) by mouth 3 (three) times daily. 02/27/24  Yes Vicci Barnie NOVAK, MD  isosorbide  dinitrate (ISORDIL ) 20 MG tablet Take 1 tablet (20 mg total) by mouth 3 (three)  times daily. 02/27/24  Yes Vicci Barnie NOVAK, MD  labetalol  (NORMODYNE ) 200 MG tablet Take 1 tablet (200 mg total) by mouth 3 (three) times daily. 02/27/24  Yes Vicci Barnie NOVAK, MD  amLODipine  (NORVASC ) 5 MG tablet Take 1 tablet (5 mg total) by mouth 2 (two) times daily. 02/27/24   Vicci Barnie NOVAK, MD  atorvastatin  (LIPITOR) 40 MG tablet Take 1 tablet (40 mg total) by mouth daily. 02/27/24   Vicci Barnie NOVAK, MD  furosemide  (LASIX ) 20 MG tablet Take 1 tablet (20 mg total) by mouth daily. 02/27/24   Vicci Barnie NOVAK, MD  lisinopril  (ZESTRIL ) 2.5 MG tablet Take 1 tablet (2.5 mg total) by mouth daily. Must keep appointment in March for additional refill 02/27/24   Vicci Barnie NOVAK, MD    Family History Family History  Problem Relation Age of Onset   Stroke Mother     Social History Social History   Tobacco Use   Smoking status: Never   Smokeless tobacco: Never  Vaping Use   Vaping status: Never Used  Substance Use Topics   Alcohol use: No   Drug use: Never     Allergies   Patient has no known allergies.   Review of Systems Review of Systems  Per HPI  Physical Exam Triage Vital Signs ED Triage Vitals [07/03/24 1141]  Encounter Vitals Group     BP (!) 189/98     Girls Systolic BP Percentile  Girls Diastolic BP Percentile      Boys Systolic BP Percentile      Boys Diastolic BP Percentile      Pulse Rate 76     Resp 16     Temp (!) 97.2 F (36.2 C)     Temp Source Oral     SpO2 94 %     Weight      Height      Head Circumference      Peak Flow      Pain Score 0     Pain Loc      Pain Education      Exclude from Growth Chart    No data found.  Updated Vital Signs BP (!) 189/98 (BP Location: Left Arm)   Pulse 76   Temp (!) 97.2 F (36.2 C) (Oral)   Resp 16   SpO2 94%   Visual Acuity Right Eye Distance:   Left Eye Distance:   Bilateral Distance:    Right Eye Near:   Left Eye Near:    Bilateral Near:     Physical Exam Vitals and nursing  note reviewed.  Constitutional:      General: She is awake. She is not in acute distress.    Appearance: Normal appearance. She is well-developed and well-groomed. She is not ill-appearing.  Neurological:     General: No focal deficit present.     Mental Status: She is alert and oriented to person, place, and time. Mental status is at baseline.  Psychiatric:        Behavior: Behavior is cooperative.      UC Treatments / Results  Labs (all labs ordered are listed, but only abnormal results are displayed) Labs Reviewed - No data to display  EKG   Radiology No results found.  Procedures Procedures (including critical care time)  Medications Ordered in UC Medications - No data to display  Initial Impression / Assessment and Plan / UC Course  I have reviewed the triage vital signs and the nursing notes.  Pertinent labs & imaging results that were available during my care of the patient were reviewed by me and considered in my medical decision making (see chart for details).     Patient is overall well-appearing.  Vitals are stable.  No prescription sent because pharmacy staff at [facility] health community pharmacy at [place] long reported that they would be able to transfer her prescriptions over to them to get them refilled today.  Discussed follow-up and return precautions. Final Clinical Impressions(s) / UC Diagnoses   Final diagnoses:  Medication refill     Discharge Instructions      I spoke with the pharmacy staff at the [facility] health community pharmacy at the [facility] location and they were able to refill your hydralazine , labetalol , and isosorbide  for you today.   They close at 4:30 today so please be sure to go pick these up before they closed today.   ED Prescriptions   None    PDMP not reviewed this encounter.   Johnie Flaming A, NP 07/03/24 1204

## 2024-07-03 NOTE — ED Triage Notes (Signed)
 Patient here today needing 3 medications refilled till Monday until she can go to her pharmacy and pick up the regular refills. Patient forgot to pick up her medication yesterday and is out. Patient needing Hydralazine , Labetalol , and Isosorbide .

## 2024-07-03 NOTE — Discharge Instructions (Signed)
 I spoke with the pharmacy staff at the [facility] health community pharmacy at the [facility] location and they were able to refill your hydralazine , labetalol , and isosorbide  for you today.   They close at 4:30 today so please be sure to go pick these up before they closed today.

## 2024-07-05 ENCOUNTER — Other Ambulatory Visit: Payer: Self-pay

## 2024-08-02 ENCOUNTER — Ambulatory Visit: Payer: Self-pay

## 2024-08-02 ENCOUNTER — Other Ambulatory Visit (HOSPITAL_COMMUNITY): Payer: Self-pay

## 2024-08-02 NOTE — Telephone Encounter (Signed)
 FYI Only or Action Required?: FYI only for provider.  Patient was last seen in primary care on 02/27/2024 by Vicci Barnie NOVAK, MD.  Called Nurse Triage reporting Fall and Wound Check.  Symptoms began several weeks ago.  Interventions attempted: OTC medications: alcohol, neosporin.  Symptoms are: left lower leg/ankle wound about 1.5 inches with redness and pain , clear drainage from wound gradually worsening. Right shoulder pain intermittently.  Triage Disposition: See Physician Within 24 Hours  Patient/caregiver understands and will follow disposition?: Yes          Copied from CRM [id number]. Topic: Clinical - Red Word Triage >> Aug 02, 2024 10:13 AM Pinkey ORN wrote: Red Word that prompted transfer to Nurse Triage: Fall >> Aug 02, 2024 10:14 AM Pinkey ORN wrote: Patient states she experienced a fall more than 3 weeks from now, but states the wound on her left leg isn't healing.  Reason for Disposition  [1] Looks infected (e.g., spreading redness, pus) AND [2] no fever  Answer Assessment - Initial Assessment Questions 1. LOCATION: Where is the wound located?      Left leg, near ankle.  2. WOUND APPEARANCE: What does the wound look like?      Drainage from wound (clear). Redness surrounding wound. She states she changes the bandage every other day.  3. SIZE: If redness is present, ask: What is the size of the red area? (Inches, centimeters, or compare to size of a coin)      1.5 inches.  4. SPREAD: What's changed in the last day?  Do you see any red streaks coming from the wound?     She states when she sleeps on it, theres drainage. Denies red streaks coming from wound.  5. ONSET: When did it start to look infected?      X 1 week.  6. MECHANISM: How did the wound start, what was the cause?     Fell on concrete 3 weeks ago, scraped her leg.  7. PAIN: Do you have any pain?  If Yes, ask: How bad is the pain?  (e.g., Scale 1-10; mild, moderate, or  severe)     4/10.  8. FEVER: Do you have a fever? If Yes, ask: What is your temperature, how was it measured, and when did it start?     No.  9. OTHER SYMPTOMS: Do you have any other symptoms? (e.g., shaking chills, weakness, rash elsewhere on body)     Denies rash, weakness in left leg, chills, body aches. Patient states she also hurt her right shoulder when she fell. She states she has occasional pain in the shoulder.  Patient states she uses alcohol to clean the wound and applies neosporin.  Protocols used: Wound Infection Suspected-A-AH

## 2024-08-03 ENCOUNTER — Ambulatory Visit: Payer: Self-pay | Admitting: Family Medicine

## 2024-08-03 ENCOUNTER — Other Ambulatory Visit: Payer: Self-pay

## 2024-08-03 ENCOUNTER — Telehealth: Payer: Self-pay | Admitting: Internal Medicine

## 2024-08-03 ENCOUNTER — Ambulatory Visit: Payer: Self-pay | Attending: Nurse Practitioner | Admitting: Nurse Practitioner

## 2024-08-03 ENCOUNTER — Encounter: Payer: Self-pay | Admitting: Nurse Practitioner

## 2024-08-03 VITALS — BP 168/90 | HR 89 | Resp 19 | Ht 61.0 in | Wt 234.0 lb

## 2024-08-03 DIAGNOSIS — M25511 Pain in right shoulder: Secondary | ICD-10-CM

## 2024-08-03 DIAGNOSIS — L089 Local infection of the skin and subcutaneous tissue, unspecified: Secondary | ICD-10-CM

## 2024-08-03 MED ORDER — DOXYCYCLINE HYCLATE 100 MG PO TABS
100.0000 mg | ORAL_TABLET | Freq: Two times a day (BID) | ORAL | 0 refills | Status: AC
  Filled 2024-08-03: qty 10, 5d supply, fill #0

## 2024-08-03 MED ORDER — TRAMADOL HCL 50 MG PO TABS
50.0000 mg | ORAL_TABLET | Freq: Three times a day (TID) | ORAL | 0 refills | Status: AC | PRN
  Filled 2024-08-03: qty 15, 5d supply, fill #0

## 2024-08-03 MED ORDER — CEPHALEXIN 500 MG PO CAPS
500.0000 mg | ORAL_CAPSULE | Freq: Two times a day (BID) | ORAL | 0 refills | Status: AC
  Filled 2024-08-03: qty 14, 7d supply, fill #0

## 2024-08-03 NOTE — Progress Notes (Signed)
 Assessment & Plan:  [name] was seen today for ankle injury.  Diagnoses and all orders for this visit:  Skin infection -     CBC with Differential -     doxycycline (VIBRA-TABS) 100 MG tablet; Take 1 tablet (100 mg total) by mouth 2 (two) times daily for 5 days. -     cephALEXin (KEFLEX) 500 MG capsule; Take 1 capsule (500 mg total) by mouth 2 (two) times daily for 7 days. -     DG Tibia/Fibula Left; Future -     traMADol (ULTRAM) 50 MG tablet; Take 1 tablet (50 mg total) by mouth every 8 (eight) hours as needed for up to 5 days. Persistent infection post-fall requires antibiotics and proper wound care. - Prescribe antibiotics. - Instruct to clean wound with soap and water  - Advise against alcohol for wound cleaning. - Recommend gauze for wound coverage, avoid Ace Wrap or Band-Aids. - Order CBC to assess for elevated white blood cell count. - Schedule follow-up in 5-7 days to evaluate healing. - Plan for x-ray if no improvement or worsening.   Acute pain of right shoulder -     traMADol (ULTRAM) 50 MG tablet; Take 1 tablet (50 mg total) by mouth every 8 (eight) hours as needed for up to 5 days. Shoulder pain likely due to impact soreness. Aspirin not recommended due to bleeding risk. - Advise against aspirin. - Recommend Tylenol  Arthritis for pain relief. - Caution against excessive ibuprofen use due to renal effects. - Encourage shoulder exercises for recovery.   Patient has been counseled on age-appropriate routine health concerns for screening and prevention. These are reviewed and up-to-date. Referrals have been placed accordingly. Immunizations are up-to-date or declined.    Subjective:   Chief Complaint  Patient presents with   Ankle Injury   History of Present Illness [name] is a 68 year old female who presents with a non-healing leg wound after a fall on gravel.  She is a patient of Dr. Vicci  Approximately 3-4 weeks ago, she fell on her driveway,  which is made of cement and gravel, while wearing slippers, resulting in a wound on her leg. She initially did not seek medical attention, and started using an old prescription of bactroban  and applying alcohol to the area however the wound has not healed and is also draining.  She does not have a history of  venous stasis, diabetes or tobacco dependence.  She covers the wound with gauze. Her pain level is between 5 and 6 out of 10 during the day, increasing to 7 or 8 at night, particularly when in bed. She is not currently working.  In addition to the leg wound, she experiences shoulder pain from the same fall, during which she attempted to protect her head. She did not hit her head or lose consciousness. She is able to raise her right arm above her shoulder with minimal pain. She has been taking aspirin for the shoulder pain.   Rates pain as high as 8/10 at night when attempting to sleep.        Review of Systems  Constitutional:  Negative for fever, malaise/fatigue and weight loss.  HENT: Negative.  Negative for nosebleeds.   Eyes:  Negative for photophobia.  Respiratory: Negative.  Negative for cough and shortness of breath.   Cardiovascular: Negative.  Negative for chest pain, palpitations and leg swelling.  Musculoskeletal:  Positive for joint pain. Negative for myalgias.  Skin:  SEE HPI  Neurological: Negative.  Negative for dizziness, focal weakness, seizures and headaches.  Psychiatric/Behavioral: Negative.  Negative for suicidal ideas.     Past Medical History:  Diagnosis Date   Hypertension     Past Surgical History:  Procedure Laterality Date   CESAREAN SECTION      Family History  Problem Relation Age of Onset   Stroke Mother     Social History Reviewed with no changes to be made today.   Outpatient Medications Prior to Visit  Medication Sig Dispense Refill   amLODipine  (NORVASC ) 5 MG tablet Take 1 tablet (5 mg total) by mouth 2 (two) times daily. 180  tablet 1   atorvastatin  (LIPITOR) 40 MG tablet Take 1 tablet (40 mg total) by mouth daily. 90 tablet 1   furosemide  (LASIX ) 20 MG tablet Take 1 tablet (20 mg total) by mouth daily. 90 tablet 1   hydrALAZINE  (APRESOLINE ) 100 MG tablet Take 1 tablet (100 mg total) by mouth 3 (three) times daily. 270 tablet 1   isosorbide  dinitrate (ISORDIL ) 20 MG tablet Take 1 tablet (20 mg total) by mouth 3 (three) times daily. 270 tablet 1   labetalol  (NORMODYNE ) 200 MG tablet Take 1 tablet (200 mg total) by mouth 3 (three) times daily. 270 tablet 1   lisinopril  (ZESTRIL ) 2.5 MG tablet Take 1 tablet (2.5 mg total) by mouth daily. Must keep appointment in March for additional refill 90 tablet 1   No facility-administered medications prior to visit.    No Known Allergies     Objective:    BP (!) 168/90 (BP Location: Left Arm, Patient Position: Sitting, Cuff Size: Normal)   Pulse 89   Resp 19   Ht 5' 1 (1.549 m)   Wt 234 lb (106.1 kg)   SpO2 100%   BMI 44.21 kg/m  Wt Readings from Last 3 Encounters:  08/03/24 234 lb (106.1 kg)  02/27/24 224 lb (101.6 kg)  02/25/23 223 lb (101.2 kg)    Physical Exam Vitals and nursing note reviewed.  Constitutional:      Appearance: She is well-developed.  HENT:     Head: Normocephalic and atraumatic.  Cardiovascular:     Rate and Rhythm: Normal rate and regular rhythm.     Heart sounds: Normal heart sounds. No murmur heard.    No friction rub. No gallop.  Pulmonary:     Effort: Pulmonary effort is normal. No tachypnea or respiratory distress.     Breath sounds: Normal breath sounds. No decreased breath sounds, wheezing, rhonchi or rales.  Chest:     Chest wall: No tenderness.  Musculoskeletal:        General: Normal range of motion.     Right shoulder: Tenderness present.     Cervical back: Normal range of motion.  Skin:    General: Skin is warm and dry.     Findings: Wound present.         Comments: SEE PHOTO  Neurological:     Mental Status: She  is alert and oriented to person, place, and time.     Coordination: Coordination normal.  Psychiatric:        Behavior: Behavior normal. Behavior is cooperative.        Thought Content: Thought content normal.        Judgment: Judgment normal.          Patient has been counseled extensively about nutrition and exercise as well as the importance of adherence with medications and regular  follow-up. The patient was given clear instructions to go to ER or return to medical center if symptoms don't improve, worsen or new problems develop. The patient verbalized understanding.   Follow-up: Return for right leg wound, DB with PCP at 910 next tuesday.   Haze LELON Servant, FNP-BC [facility] [place], [place] [phone]   08/03/2024, 5:34 PM

## 2024-08-03 NOTE — Telephone Encounter (Signed)
 1st attempt lvm to resch appt pcp not in the office

## 2024-08-04 ENCOUNTER — Other Ambulatory Visit: Payer: Self-pay

## 2024-08-04 LAB — CBC WITH DIFFERENTIAL/PLATELET
Basophils Absolute: 0 x10E3/uL (ref 0.0–0.2)
Basos: 1 %
EOS (ABSOLUTE): 0.2 x10E3/uL (ref 0.0–0.4)
Eos: 3 %
Hematocrit: 33.1 % — ABNORMAL LOW (ref 34.0–46.6)
Hemoglobin: 10.5 g/dL — ABNORMAL LOW (ref 11.1–15.9)
Immature Grans (Abs): 0 x10E3/uL (ref 0.0–0.1)
Immature Granulocytes: 0 %
Lymphocytes Absolute: 1.4 x10E3/uL (ref 0.7–3.1)
Lymphs: 22 %
MCH: 29.2 pg (ref 26.6–33.0)
MCHC: 31.7 g/dL (ref 31.5–35.7)
MCV: 92 fL (ref 79–97)
Monocytes Absolute: 0.6 x10E3/uL (ref 0.1–0.9)
Monocytes: 9 %
Neutrophils Absolute: 4 x10E3/uL (ref 1.4–7.0)
Neutrophils: 65 %
Platelets: 224 x10E3/uL (ref 150–450)
RBC: 3.6 x10E6/uL — ABNORMAL LOW (ref 3.77–5.28)
RDW: 12.7 % (ref 11.7–15.4)
WBC: 6.2 x10E3/uL (ref 3.4–10.8)

## 2024-08-05 ENCOUNTER — Ambulatory Visit: Payer: Self-pay | Admitting: Nurse Practitioner

## 2024-08-06 ENCOUNTER — Telehealth: Payer: Self-pay

## 2024-08-06 ENCOUNTER — Other Ambulatory Visit: Payer: Self-pay

## 2024-08-06 NOTE — Telephone Encounter (Signed)
 Copied from CRM [id number]. Topic: General - Call Back - No Documentation >> Aug 06, 2024 10:36 AM Gustabo D wrote: Returning call pt wants to speak with who called her

## 2024-08-06 NOTE — Telephone Encounter (Signed)
 Call to patient unable to reach VM left. If patient returns call please advise per PCP Labs show mild anemia. I recommend you follow up with your PCP to see if a colonoscopy is warranted.

## 2024-08-12 ENCOUNTER — Telehealth: Payer: Self-pay | Admitting: Internal Medicine

## 2024-08-12 NOTE — Telephone Encounter (Signed)
 Patient was originally scheduled for 11/7 but called to cancel the appointment due to lack of transportation. I contacted the patient and informed her that we could keep the appointment as transportation could be arranged. The patient was appreciative and agreed to keep the appointment. Transportation has been arranged for tomorrow's visit.

## 2024-08-12 NOTE — Telephone Encounter (Signed)
 Copied from CRM #[id number]. Topic: Appointments - Scheduling Inquiry for Clinic >> Aug 12, 2024 11:02 AM Amy B wrote:  Reason for CRM: Patient had to reschedule her appointment due to lack of transportation.  The next available is December 9th.  Patient needs a sooner appointment for wound care followup.  Please contact patient to reschedule.

## 2024-08-12 NOTE — Telephone Encounter (Signed)
 Transportation Voucher   Pick up date and time: 08/13/2024 3:00 pm.  Pick up address: [address] [place] [address]  Drop off address: [address] [address]

## 2024-08-13 ENCOUNTER — Encounter: Payer: Self-pay | Admitting: Internal Medicine

## 2024-08-13 ENCOUNTER — Other Ambulatory Visit: Payer: Self-pay

## 2024-08-13 ENCOUNTER — Ambulatory Visit: Payer: Self-pay | Admitting: Internal Medicine

## 2024-08-13 ENCOUNTER — Ambulatory Visit: Payer: Self-pay | Attending: Internal Medicine | Admitting: Internal Medicine

## 2024-08-13 VITALS — BP 158/78 | HR 71 | Temp 97.7°F | Ht 61.0 in | Wt 225.0 lb

## 2024-08-13 DIAGNOSIS — E1169 Type 2 diabetes mellitus with other specified complication: Secondary | ICD-10-CM

## 2024-08-13 DIAGNOSIS — L97912 Non-pressure chronic ulcer of unspecified part of right lower leg with fat layer exposed: Secondary | ICD-10-CM

## 2024-08-13 DIAGNOSIS — Z6841 Body Mass Index (BMI) 40.0 and over, adult: Secondary | ICD-10-CM

## 2024-08-13 DIAGNOSIS — I129 Hypertensive chronic kidney disease with stage 1 through stage 4 chronic kidney disease, or unspecified chronic kidney disease: Secondary | ICD-10-CM

## 2024-08-13 DIAGNOSIS — N184 Chronic kidney disease, stage 4 (severe): Secondary | ICD-10-CM

## 2024-08-13 DIAGNOSIS — Z23 Encounter for immunization: Secondary | ICD-10-CM

## 2024-08-13 DIAGNOSIS — E11622 Type 2 diabetes mellitus with other skin ulcer: Secondary | ICD-10-CM

## 2024-08-13 DIAGNOSIS — E1122 Type 2 diabetes mellitus with diabetic chronic kidney disease: Secondary | ICD-10-CM

## 2024-08-13 LAB — POCT GLYCOSYLATED HEMOGLOBIN (HGB A1C): HbA1c, POC (controlled diabetic range): 6.4 % (ref 0.0–7.0)

## 2024-08-13 LAB — GLUCOSE, POCT (MANUAL RESULT ENTRY): POC Glucose: 119 mg/dL — AB (ref 70–99)

## 2024-08-13 MED ORDER — MUPIROCIN 2 % EX OINT
1.0000 | TOPICAL_OINTMENT | Freq: Every day | CUTANEOUS | 0 refills | Status: DC
  Filled 2024-08-13: qty 22, 22d supply, fill #0

## 2024-08-13 NOTE — Progress Notes (Signed)
Flu vaccine administered per protocols.  Information sheet given. Patient denies and pain or discomfort at injection site. Tolerated injection well no reaction.  

## 2024-08-13 NOTE — Progress Notes (Signed)
 Patient ID: [name], female    DOB: 1956/06/30  MRN: [id number]  CC: Wound Check (Wound check on R leg - fell 6 weeks ago and injured R leg, not healing possibly infected.Janiece to flu vax)   Subjective: [name] is a 68 y.o. female who presents for UC visit Her concerns today include:  Patient with history of DM type 2, HTN, HL, CVA 2009, CKD 3-4, ACD, obesity.   Discussed the use of AI scribe software for clinical note transcription with the patient, who gave verbal consent to proceed.  History of Present Illness [name] is a 68 year old female with diabetes, hypertension, and kidney disease who presents with a wound on her right leg.  She has a wound on her right lower leg that has been present for about a month, following a fall while exiting her house during rain. She slipped and fell in the rain. The wound drains clear fluid without pus. She experiences some pain upon pressure, but it is not severe. She has been using Neosporin and an antiseptic spray containing benzethonium chloride for wound care.  BP is elev. Her current medications include amlodipine  5 mg twice a day, hydralazine  100 mg three times a day, isosorbide  20 mg three times a day, labetalol  200 mg three times a day, and lisinopril  2.5 mg daily She confirms taking her morning and afternoon doses of hydralazine , isosorbide , and labetalol  before the visit.      Patient Active Problem List   Diagnosis Date Noted   Type 2 diabetes mellitus with morbid obesity (HCC) 06/19/2021   Postmenopausal bleeding 12/12/2020   History of abnormal cervical Pap smear 12/12/2020   CKD (chronic kidney disease) stage 4, GFR 15-29 ml/min (HCC) 03/23/2020   LGSIL on Pap smear of cervix 12/29/2019   CKD (chronic kidney disease) stage 3, GFR 30-59 ml/min (HCC) 08/14/2018   Essential hypertension 05/15/2018   Reactive depression 05/15/2018   History of CVA (cerebrovascular accident) without residual deficits  05/15/2018     Current Outpatient Medications on File Prior to Visit  Medication Sig Dispense Refill   amLODipine  (NORVASC ) 5 MG tablet Take 1 tablet (5 mg total) by mouth 2 (two) times daily. 180 tablet 1   atorvastatin  (LIPITOR) 40 MG tablet Take 1 tablet (40 mg total) by mouth daily. 90 tablet 1   furosemide  (LASIX ) 20 MG tablet Take 1 tablet (20 mg total) by mouth daily. 90 tablet 1   hydrALAZINE  (APRESOLINE ) 100 MG tablet Take 1 tablet (100 mg total) by mouth 3 (three) times daily. 270 tablet 1   isosorbide  dinitrate (ISORDIL ) 20 MG tablet Take 1 tablet (20 mg total) by mouth 3 (three) times daily. 270 tablet 1   labetalol  (NORMODYNE ) 200 MG tablet Take 1 tablet (200 mg total) by mouth 3 (three) times daily. 270 tablet 1   lisinopril  (ZESTRIL ) 2.5 MG tablet Take 1 tablet (2.5 mg total) by mouth daily. Must keep appointment in March for additional refill 90 tablet 1   No current facility-administered medications on file prior to visit.    No Known Allergies  Social History   Socioeconomic History   Marital status: Married    Spouse name: Not on file   Number of children: Not on file   Years of education: Not on file   Highest education level: Not on file  Occupational History   Occupation: retired [employer]  Tobacco Use   Smoking status: Never   Smokeless tobacco: Never  Vaping Use   Vaping status: Never Used  Substance and Sexual Activity   Alcohol use: No   Drug use: Never   Sexual activity: Not Currently  Other Topics Concern   Not on file  Social History Narrative   Not on file   Social Drivers of Health   Financial Resource Strain: Not on file  Food Insecurity: Low Risk  (05/19/2023)   Received from [facility]   Hunger Vital Sign    Within the past 12 months, you worried that your food would run out before you got money to buy more: Never true    Within the past 12 months, the food you bought just didn't last and you didn't have money to get more. : Never true   Transportation Needs: Not on file (05/19/2023)  Physical Activity: Not on file  Stress: Not on file  Social Connections: Not on file  Intimate Partner Violence: Not on file    Family History  Problem Relation Age of Onset   Stroke Mother     Past Surgical History:  Procedure Laterality Date   CESAREAN SECTION      ROS: Review of Systems Negative except as stated above  PHYSICAL EXAM: BP (!) 158/78   Pulse 71   Temp 97.7 F (36.5 C) (Oral)   Ht 5' 1 (1.549 m)   Wt 225 lb (102.1 kg)   SpO2 95%   BMI 42.51 kg/m   Wt Readings from Last 3 Encounters:  08/13/24 225 lb (102.1 kg)  08/03/24 234 lb (106.1 kg)  02/27/24 224 lb (101.6 kg)    Physical Exam  General appearance - alert, well appearing, and in no distress Mental status - normal mood, behavior, speech, dress, motor activity, and thought processes Skin - LLE: 4x4 cm stage 2 ulcer on lateral aspect of the lower leg. Exudates/crusting surrounds and in bed of ulcer. No erythema No tenderness or fluctuance on palpation around the ulcer.  She has good 3+ dorsalis pedis and posterior tibialis pulses in the left foot.      Results for orders placed or performed in visit on 08/13/24  POCT glucose (manual entry)   Collection Time: 08/13/24  4:50 PM  Result Value Ref Range   POC Glucose 119 (A) 70 - 99 mg/dl  POCT glycosylated hemoglobin (Hb A1C)   Collection Time: 08/13/24  4:54 PM  Result Value Ref Range   Hemoglobin A1C     HbA1c POC (<> result, manual entry)     HbA1c, POC (prediabetic range)     HbA1c, POC (controlled diabetic range) 6.4 0.0 - 7.0 %        Latest Ref Rng & Units 02/27/2024   11:49 AM 02/25/2023   12:25 PM 10/25/2021    3:32 PM  CMP  Glucose 70 - 99 mg/dL 877  889  95   BUN 8 - 27 mg/dL 30  25  43   Creatinine 0.57 - 1.00 mg/dL 8.13  8.01  7.40   Sodium 134 - 144 mmol/L 142  142  142   Potassium 3.5 - 5.2 mmol/L 4.4  3.7  4.5   Chloride 96 - 106 mmol/L 108  104  105   CO2 20 - 29  mmol/L 17  22  22    Calcium  8.7 - 10.3 mg/dL 89.9  9.8  9.8   Total Protein 6.0 - 8.5 g/dL 7.1  7.2  7.4   Total Bilirubin 0.0 - 1.2 mg/dL 0.3  0.4  0.3  Alkaline Phos 44 - 121 IU/L 64  70  65   AST 0 - 40 IU/L 18  22  25    ALT 0 - 32 IU/L 12  17  18     Lipid Panel     Component Value Date/Time   CHOL 180 02/27/2024 1149   TRIG 59 02/27/2024 1149   HDL 89 02/27/2024 1149   CHOLHDL 2.0 02/27/2024 1149   CHOLHDL 2.2 Ratio 11/29/2010 2115   VLDL 13 11/29/2010 2115   LDLCALC 80 02/27/2024 1149   LDLDIRECT 108 (H) 05/07/2010 1841    CBC    Component Value Date/Time   WBC 6.2 08/03/2024 1536   WBC 5.9 04/19/2021 2214   RBC 3.60 (L) 08/03/2024 1536   RBC 4.00 04/19/2021 2214   HGB 10.5 (L) 08/03/2024 1536   HCT 33.1 (L) 08/03/2024 1536   PLT 224 08/03/2024 1536   MCV 92 08/03/2024 1536   MCH 29.2 08/03/2024 1536   MCH 29.8 04/19/2021 2214   MCHC 31.7 08/03/2024 1536   MCHC 32.9 04/19/2021 2214   RDW 12.7 08/03/2024 1536   LYMPHSABS 1.4 08/03/2024 1536   MONOABS 0.4 01/06/2014 1802   EOSABS 0.2 08/03/2024 1536   BASOSABS 0.0 08/03/2024 1536    ASSESSMENT AND PLAN: 1. Leg ulcer, right, with fat layer exposed (HCC) (Primary) Chronic diabetic leg ulcer.  Does not appear to extend to the bone.  She will need dressing changes.  Dressing change done today.  I cleaned it up with sterile water and then put some antibiotic ointment on it. It was covered it with gauze. Patient given supplies including sterile water, gauze and tape.  Prescription sent to the pharmacy for Bactroban  ointment.  Advised to clean the area daily and apply clean dressing.  Will try to get her to the wound clinic as soon as possible.  Message sent to referral coordinator. - mupirocin  ointment (BACTROBAN ) 2 %; Apply small amount TOPICALLY to affected area once a day.  Dispense: 22 g; Refill: 0 - AMB referral to wound care center  2. Type 2 diabetes mellitus with morbid obesity (HCC) A1c was at goal but this  was not addressed fully today. - POCT glucose (manual entry) - POCT glycosylated hemoglobin (Hb A1C)  3. Hypertensive kidney disease with stage 4 chronic kidney disease (HCC) Not at goal.  She is on several blood pressure medications already.  Advised to keep a log of blood pressure readings and bring them with her on visit with me in 1 month for chronic disease management.  In the meantime she will continue current blood pressure medications listed above.   Patient was given the opportunity to ask questions.  Patient verbalized understanding of the plan and was able to repeat key elements of the plan.   This documentation was completed using Paediatric nurse.  Any transcriptional errors are unintentional.  Orders Placed This Encounter  Procedures   Flu vaccine HIGH DOSE PF(Fluzone Trivalent)   AMB referral to wound care center   POCT glucose (manual entry)   POCT glycosylated hemoglobin (Hb A1C)     Requested Prescriptions   Signed Prescriptions Disp Refills   mupirocin  ointment (BACTROBAN ) 2 % 22 g 0    Sig: Apply small amount TOPICALLY to affected area once a day.    Return in about 4 weeks (around 09/10/2024).  Barnie Louder, MD, FACP

## 2024-08-13 NOTE — Patient Instructions (Signed)
  VISIT SUMMARY: Today, we discussed the wound on your right leg, your blood pressure, and your overall health. You have a chronic ulcer on your right lower leg that has been present for about a month. Your blood pressure was elevated during the visit. We reviewed your current medications and overall health status.  YOUR PLAN: -CHRONIC ULCER OF RIGHT LOWER LEG: A chronic ulcer is a long-lasting sore that can take a long time to heal. Your wound is draining clear fluid and is painful when pressed. You should clean the wound daily with water and a cloth, apply the prescribed antibiotic ointment, and follow up with the wound care clinic. We will see you again in three weeks to check on your progress.  -HYPERTENSION: Hypertension means high blood pressure. Your blood pressure was elevated at 160/91 today. You should monitor your blood pressure at home, record the readings, and bring them to your next appointment so we can adjust your treatment if necessary.  INSTRUCTIONS: Please follow up with the wound care clinic as referred. Additionally, monitor your blood pressure at home, record the readings, and bring them to your next appointment. We will see you again in three weeks to check on your progress.                      Contains text generated by Abridge.                                 Contains text generated by Abridge.

## 2024-08-20 ENCOUNTER — Telehealth: Payer: Self-pay | Admitting: Internal Medicine

## 2024-08-20 NOTE — Telephone Encounter (Signed)
 Copied from CRM #[id number]. Topic: Clinical - Lab/Test Results >> Aug 20, 2024 11:45 AM Victoria B wrote:  Reason for CRM: patient called about A1c results , no notes for results are in yet. Please cb when notes are in

## 2024-08-22 ENCOUNTER — Telehealth: Payer: Self-pay | Admitting: Internal Medicine

## 2024-08-22 NOTE — Telephone Encounter (Signed)
-----   Message from Altamese RAMAN sent at 08/20/2024 10:00 AM EST ----- Regarding: RE: Needs WOUND CARE CLINIC ASAP Wound  care  call twice  unable to  contact patient     General 08/19/2024 10:30 AM Teresa Bosch D Called patient to schedule appt no answer left msg   - Note: Called patient to schedule appt no answer left msg   . Type Date User Summary Attachment General 08/18/2024 10:07 AM Teresa Bosch D Called patient to schedule appt no answer left msg - Note: Called patient to schedule appt no answer left msg ----- Message ----- From: Vicci Barnie NOVAK, MD Sent: 08/13/2024   5:52 PM EST To: Altamese FORBES Adler Subject: Needs WOUND CARE CLINIC ASAP

## 2024-08-23 NOTE — Telephone Encounter (Signed)
 Called but no answer. Unable to LVM due to VM being full.  A1C results were entered the same day it was collected. Patient does not have access to MyChart. If patient calls back please clarify question.

## 2024-09-10 ENCOUNTER — Other Ambulatory Visit: Payer: Self-pay

## 2024-09-10 ENCOUNTER — Ambulatory Visit: Payer: Self-pay | Attending: Internal Medicine | Admitting: Internal Medicine

## 2024-09-10 ENCOUNTER — Encounter: Payer: Self-pay | Admitting: Internal Medicine

## 2024-09-10 VITALS — BP 183/91 | HR 83 | Ht 61.0 in | Wt 228.0 lb

## 2024-09-10 DIAGNOSIS — I129 Hypertensive chronic kidney disease with stage 1 through stage 4 chronic kidney disease, or unspecified chronic kidney disease: Secondary | ICD-10-CM

## 2024-09-10 DIAGNOSIS — N184 Chronic kidney disease, stage 4 (severe): Secondary | ICD-10-CM

## 2024-09-10 DIAGNOSIS — E1169 Type 2 diabetes mellitus with other specified complication: Secondary | ICD-10-CM

## 2024-09-10 DIAGNOSIS — L97912 Non-pressure chronic ulcer of unspecified part of right lower leg with fat layer exposed: Secondary | ICD-10-CM

## 2024-09-10 DIAGNOSIS — E785 Hyperlipidemia, unspecified: Secondary | ICD-10-CM

## 2024-09-10 DIAGNOSIS — M25511 Pain in right shoulder: Secondary | ICD-10-CM

## 2024-09-10 MED ORDER — HYDRALAZINE HCL 100 MG PO TABS
100.0000 mg | ORAL_TABLET | Freq: Three times a day (TID) | ORAL | 1 refills | Status: AC
  Filled 2024-09-10: qty 90, 30d supply, fill #0
  Filled 2024-10-12: qty 90, 30d supply, fill #1

## 2024-09-10 MED ORDER — FUROSEMIDE 20 MG PO TABS
20.0000 mg | ORAL_TABLET | Freq: Every day | ORAL | 1 refills | Status: AC
  Filled 2024-09-10: qty 30, 30d supply, fill #0
  Filled 2024-10-12: qty 30, 30d supply, fill #1

## 2024-09-10 MED ORDER — ATORVASTATIN CALCIUM 40 MG PO TABS
40.0000 mg | ORAL_TABLET | Freq: Every day | ORAL | 1 refills | Status: AC
  Filled 2024-09-10: qty 30, 30d supply, fill #0
  Filled 2024-10-12: qty 30, 30d supply, fill #1

## 2024-09-10 MED ORDER — LABETALOL HCL 200 MG PO TABS
200.0000 mg | ORAL_TABLET | Freq: Three times a day (TID) | ORAL | 1 refills | Status: AC
  Filled 2024-09-10 – 2024-09-11 (×2): qty 90, 30d supply, fill #0
  Filled 2024-10-12: qty 90, 30d supply, fill #1

## 2024-09-10 MED ORDER — MUPIROCIN 2 % EX OINT
1.0000 | TOPICAL_OINTMENT | Freq: Every day | CUTANEOUS | 0 refills | Status: AC
  Filled 2024-09-10: qty 22, 22d supply, fill #0

## 2024-09-10 MED ORDER — LISINOPRIL 2.5 MG PO TABS
2.5000 mg | ORAL_TABLET | Freq: Every day | ORAL | 1 refills | Status: AC
  Filled 2024-09-10: qty 30, 30d supply, fill #0
  Filled 2024-10-12: qty 30, 30d supply, fill #1

## 2024-09-10 MED ORDER — AMLODIPINE BESYLATE 5 MG PO TABS
5.0000 mg | ORAL_TABLET | Freq: Two times a day (BID) | ORAL | 1 refills | Status: AC
  Filled 2024-09-10: qty 60, 30d supply, fill #0
  Filled 2024-10-12: qty 60, 30d supply, fill #1

## 2024-09-10 MED ORDER — ACETAMINOPHEN-CODEINE 300-30 MG PO TABS
2.0000 | ORAL_TABLET | Freq: Two times a day (BID) | ORAL | 0 refills | Status: DC | PRN
  Filled 2024-09-10: qty 30, 8d supply, fill #0

## 2024-09-10 MED ORDER — ISOSORBIDE DINITRATE 20 MG PO TABS
20.0000 mg | ORAL_TABLET | Freq: Three times a day (TID) | ORAL | 1 refills | Status: AC
  Filled 2024-09-10: qty 90, 30d supply, fill #0
  Filled 2024-09-10: qty 270, 90d supply, fill #0

## 2024-09-10 MED ORDER — AMOXICILLIN-POT CLAVULANATE 500-125 MG PO TABS
1.0000 | ORAL_TABLET | Freq: Two times a day (BID) | ORAL | 0 refills | Status: AC
  Filled 2024-09-10: qty 14, 7d supply, fill #0

## 2024-09-10 NOTE — Progress Notes (Signed)
 Patient ID: [name], female    DOB: 1956-06-10  MRN: [id number]  CC: Mouth Lesions (Left leg ulcer f/u - reports cream is causing pain. Yellow d/c /R arm pain due to fall - reports Tramadol  given not alleviating/)   Subjective: [name] is a 68 y.o. female who presents for chronic ds management. Her concerns today include:  Patient with history of DM type 2, HTN, HL, CVA 2009, CKD 3-4, ACD, obesity.   Discussed the use of AI scribe software for clinical note transcription with the patient, who gave verbal consent to proceed.  History of Present Illness [name] is a 68 year old female with diabetes and hypertension who presents for a one-month follow-up for evaluation of a left leg ulcer.  She has been managing a persistent ulcer on her left leg with mupirocin  antibiotic cream and dressing changes using sterile water and gauze, sometimes twice daily due to drainage.  She feels that it is healing and that the ulcer is smaller compared to when I saw her 1 month ago.   She was referred to a wound clinic but they have not been able to get in contact with her.  She tells me that she has not seen them due to scheduling conflicts with her son's appointments.  She needs her husband to drive her to appointments.  In October, she experienced a fall that resulted in an injury to her right shoulder and the development of the ulcer on her left leg. The shoulder pain is constant, worsens with movement, and limits her ability to lift her arm or lay on her right side. Due to the leg ulcer, she is unable to lay on her left side, leading her to sleep in a chair. She was previously prescribed Ultram  by our NP, which did not alleviate her pain.  Her blood pressure is elevated today, and she reports running out of all of her  blood pressure medications since yesterday. She has not taken her medications today. Plans to pick up today.    Patient Active Problem List   Diagnosis Date Noted    Type 2 diabetes mellitus with morbid obesity (HCC) 06/19/2021   Postmenopausal bleeding 12/12/2020   History of abnormal cervical Pap smear 12/12/2020   CKD (chronic kidney disease) stage 4, GFR 15-29 ml/min (HCC) 03/23/2020   LGSIL on Pap smear of cervix 12/29/2019   CKD (chronic kidney disease) stage 3, GFR 30-59 ml/min (HCC) 08/14/2018   Essential hypertension 05/15/2018   Reactive depression 05/15/2018   History of CVA (cerebrovascular accident) without residual deficits 05/15/2018     Current Outpatient Medications on File Prior to Visit  Medication Sig Dispense Refill   amLODipine  (NORVASC ) 5 MG tablet Take 1 tablet (5 mg total) by mouth 2 (two) times daily. 180 tablet 1   atorvastatin  (LIPITOR) 40 MG tablet Take 1 tablet (40 mg total) by mouth daily. 90 tablet 1   furosemide  (LASIX ) 20 MG tablet Take 1 tablet (20 mg total) by mouth daily. 90 tablet 1   hydrALAZINE  (APRESOLINE ) 100 MG tablet Take 1 tablet (100 mg total) by mouth 3 (three) times daily. 270 tablet 1   isosorbide  dinitrate (ISORDIL ) 20 MG tablet Take 1 tablet (20 mg total) by mouth 3 (three) times daily. 270 tablet 1   labetalol  (NORMODYNE ) 200 MG tablet Take 1 tablet (200 mg total) by mouth 3 (three) times daily. 270 tablet 1   lisinopril  (ZESTRIL ) 2.5 MG tablet Take 1 tablet (2.5  mg total) by mouth daily. Must keep appointment in March for additional refill 90 tablet 1   mupirocin  ointment (BACTROBAN ) 2 % Apply small amount TOPICALLY to affected area once a day. 22 g 0   No current facility-administered medications on file prior to visit.    No Known Allergies  Social History   Socioeconomic History   Marital status: Married    Spouse name: Not on file   Number of children: Not on file   Years of education: Not on file   Highest education level: Not on file  Occupational History   Occupation: retired [employer]  Tobacco Use   Smoking status: Never   Smokeless tobacco: Never  Vaping Use   Vaping status: Never  Used  Substance and Sexual Activity   Alcohol use: No   Drug use: Never   Sexual activity: Not Currently  Other Topics Concern   Not on file  Social History Narrative   Not on file   Social Drivers of Health   Financial Resource Strain: Not on file  Food Insecurity: Low Risk  (05/19/2023)   Received from [facility]   Hunger Vital Sign    Within the past 12 months, you worried that your food would run out before you got money to buy more: Never true    Within the past 12 months, the food you bought just didn't last and you didn't have money to get more. : Never true  Transportation Needs: Not on file (05/19/2023)  Physical Activity: Not on file  Stress: Not on file  Social Connections: Not on file  Intimate Partner Violence: Not on file    Family History  Problem Relation Age of Onset   Stroke Mother     Past Surgical History:  Procedure Laterality Date   CESAREAN SECTION      ROS: Review of Systems Negative except as stated above  PHYSICAL EXAM: BP (!) 183/91 (BP Location: Right Arm, Patient Position: Sitting, Cuff Size: Large)   Pulse 83   Ht 5' 1 (1.549 m)   Wt 228 lb (103.4 kg)   SpO2 96%   BMI 43.08 kg/m   Physical Exam  General appearance - alert, well appearing, and in no distress Mental status - flat affect; answers questions appropriately Musculoskeletal -right shoulder: No point tenderness.  Moderate discomfort with attempted elevation.  Drop arm test positive. Skin -4 x 4 centimeter exudative ulcer on the left lower leg laterally.  It is draining yellowish fluid.  No surrounding erythema.  No palpated fluctuance in the soft tissue surrounding it.       Latest Ref Rng & Units 02/27/2024   11:49 AM 02/25/2023   12:25 PM 10/25/2021    3:32 PM  CMP  Glucose 70 - 99 mg/dL 877  889  95   BUN 8 - 27 mg/dL 30  25  43   Creatinine 0.57 - 1.00 mg/dL 8.13  8.01  7.40   Sodium 134 - 144 mmol/L 142  142  142   Potassium 3.5 - 5.2 mmol/L 4.4  3.7  4.5    Chloride 96 - 106 mmol/L 108  104  105   CO2 20 - 29 mmol/L 17  22  22    Calcium  8.7 - 10.3 mg/dL 89.9  9.8  9.8   Total Protein 6.0 - 8.5 g/dL 7.1  7.2  7.4   Total Bilirubin 0.0 - 1.2 mg/dL 0.3  0.4  0.3   Alkaline Phos 44 - 121 IU/L  64  70  65   AST 0 - 40 IU/L 18  22  25    ALT 0 - 32 IU/L 12  17  18     Lipid Panel     Component Value Date/Time   CHOL 180 02/27/2024 1149   TRIG 59 02/27/2024 1149   HDL 89 02/27/2024 1149   CHOLHDL 2.0 02/27/2024 1149   CHOLHDL 2.2 Ratio 11/29/2010 2115   VLDL 13 11/29/2010 2115   LDLCALC 80 02/27/2024 1149   LDLDIRECT 108 (H) 05/07/2010 1841    CBC    Component Value Date/Time   WBC 6.2 08/03/2024 1536   WBC 5.9 04/19/2021 2214   RBC 3.60 (L) 08/03/2024 1536   RBC 4.00 04/19/2021 2214   HGB 10.5 (L) 08/03/2024 1536   HCT 33.1 (L) 08/03/2024 1536   PLT 224 08/03/2024 1536   MCV 92 08/03/2024 1536   MCH 29.2 08/03/2024 1536   MCH 29.8 04/19/2021 2214   MCHC 31.7 08/03/2024 1536   MCHC 32.9 04/19/2021 2214   RDW 12.7 08/03/2024 1536   LYMPHSABS 1.4 08/03/2024 1536   MONOABS 0.4 01/06/2014 1802   EOSABS 0.2 08/03/2024 1536   BASOSABS 0.0 08/03/2024 1536    ASSESSMENT AND PLAN: 1. Leg ulcer, right, with fat layer exposed (HCC) This ulcer does not appear to be healing.  Risks of it getting infected leading to more serious issues.  Strongly advised patient that she calls the wound care clinic and schedule an appointment as soon as possible.  Printed information given to her on her discharge summary with the location and phone number.  Continue daily dressing changes. - mupirocin  ointment (BACTROBAN ) 2 %; Apply small amount TOPICALLY to affected area once a day.  Dispense: 22 g; Refill: 0 - amoxicillin -clavulanate (AUGMENTIN ) 500-125 MG tablet; Take 1 tablet by mouth in the morning and at bedtime.  Dispense: 14 tablet; Refill: 0  2. Acute pain of right shoulder (Primary) I suspect rotator cuff injury. Will get x-ray to rule out any  occult fracture and referred to orthopedics.  Inform her to apply for the [facility] discount or orange card.  She can pick up these forms at the front desk today. Limited supply of Tylenol  with codeine  given.  Advise that it is a narcotic medication that can cause drowsiness.  Do not take when she has to drive or operate any machinery. - DG Shoulder Right; Future - AMB referral to orthopedics - acetaminophen -codeine  (TYLENOL  #3) 300-30 MG tablet; Take 2 tablets by mouth 2 (two) times daily as needed for moderate pain (pain score 4-6).  Dispense: 30 tablet; Refill: 0  3. Hypertensive kidney disease with stage 4 chronic kidney disease (HCC) Blood pressure elevated today.  She has been out of all of her blood pressure medications.  Refills sent. - amLODipine  (NORVASC ) 5 MG tablet; Take 1 tablet (5 mg total) by mouth 2 (two) times daily.  Dispense: 180 tablet; Refill: 1 - furosemide  (LASIX ) 20 MG tablet; Take 1 tablet (20 mg total) by mouth daily.  Dispense: 90 tablet; Refill: 1 - hydrALAZINE  (APRESOLINE ) 100 MG tablet; Take 1 tablet (100 mg total) by mouth 3 (three) times daily.  Dispense: 270 tablet; Refill: 1 - isosorbide  dinitrate (ISORDIL ) 20 MG tablet; Take 1 tablet (20 mg total) by mouth 3 (three) times daily.  Dispense: 270 tablet; Refill: 1 - labetalol  (NORMODYNE ) 200 MG tablet; Take 1 tablet (200 mg total) by mouth 3 (three) times daily.  Dispense: 270 tablet; Refill: 1 - lisinopril  (ZESTRIL )  2.5 MG tablet; Take 1 tablet (2.5 mg total) by mouth daily. Must keep appointment in March for additional refill  Dispense: 90 tablet; Refill: 1  4. Hyperlipidemia associated with type 2 diabetes mellitus (HCC) Refill sent on atorvastatin  per her request. - atorvastatin  (LIPITOR) 40 MG tablet; Take 1 tablet (40 mg total) by mouth daily.  Dispense: 90 tablet; Refill: 1  Patient was given the opportunity to ask questions.  Patient verbalized understanding of the plan and was able to repeat key elements of the  plan.   This documentation was completed using Paediatric nurse.  Any transcriptional errors are unintentional.  No orders of the defined types were placed in this encounter.    Requested Prescriptions   Pending Prescriptions Disp Refills   amLODipine  (NORVASC ) 5 MG tablet 180 tablet 1    Sig: Take 1 tablet (5 mg total) by mouth 2 (two) times daily.   atorvastatin  (LIPITOR) 40 MG tablet 90 tablet 1    Sig: Take 1 tablet (40 mg total) by mouth daily.   furosemide  (LASIX ) 20 MG tablet 90 tablet 1    Sig: Take 1 tablet (20 mg total) by mouth daily.   hydrALAZINE  (APRESOLINE ) 100 MG tablet 270 tablet 1    Sig: Take 1 tablet (100 mg total) by mouth 3 (three) times daily.   isosorbide  dinitrate (ISORDIL ) 20 MG tablet 270 tablet 1    Sig: Take 1 tablet (20 mg total) by mouth 3 (three) times daily.   labetalol  (NORMODYNE ) 200 MG tablet 270 tablet 1    Sig: Take 1 tablet (200 mg total) by mouth 3 (three) times daily.   lisinopril  (ZESTRIL ) 2.5 MG tablet 90 tablet 1    Sig: Take 1 tablet (2.5 mg total) by mouth daily. Must keep appointment in March for additional refill   mupirocin  ointment (BACTROBAN ) 2 % 22 g 0    Sig: Apply small amount TOPICALLY to affected area once a day.    No follow-ups on file.  Barnie Louder, MD, FACP

## 2024-09-10 NOTE — Patient Instructions (Addendum)
 Placed in [facility]  [address] [place], [place] [address] Ph# [phone]   VISIT SUMMARY: Today, you came in for a follow-up on your chronic conditions and to evaluate a persistent ulcer on your left leg. We discussed your ongoing issues with the leg ulcer, shoulder pain from a previous fall, and elevated blood pressure due to running out of your medications.  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 This type of ulcer can lead to serious infections if not treated properly. You have been referred to a wound clinic for specialist evaluation and prescribed oral antibiotics. It is very important to visit the wound clinic as soon as possible to prevent further complications, including the risk of bone infection and potential limb loss.  -ROTATOR CUFF INJURY OF RIGHT SHOULDER: Your right shoulder pain is likely due to a rotator cuff injury, which affects the muscles and tendons that stabilize your shoulder. An x-ray has been ordered to get a better look at your shoulder, and you have been referred to an orthopedic specialist for further evaluation. For pain management, you have been prescribed Tylenol  with codeine , but be aware that it can cause drowsiness and constipation.  -HYPERTENSIVE CHRONIC KIDNEY DISEASE, STAGE 4: Your blood pressure is elevated today because you ran out of your medications. High blood pressure can worsen kidney disease, so it is important to take your medications regularly. A new prescription for your blood pressure medications has been sent to your pharmacy.  INSTRUCTIONS: Please follow up with the wound clinic as soon as possible for your leg ulcer. Also, get the x-ray of your right shoulder done at [facility] and follow up with the orthopedic specialist. Make sure to pick up and take your blood pressure medications regularly to manage your  hypertension and protect your kidney health.                      Contains text generated by Abridge.                                 Contains text generated by Abridge.

## 2024-09-11 ENCOUNTER — Other Ambulatory Visit: Payer: Self-pay

## 2024-09-11 ENCOUNTER — Other Ambulatory Visit (HOSPITAL_COMMUNITY): Payer: Self-pay

## 2024-09-13 ENCOUNTER — Other Ambulatory Visit (HOSPITAL_COMMUNITY): Payer: Self-pay

## 2024-09-14 ENCOUNTER — Ambulatory Visit: Payer: Self-pay | Admitting: Internal Medicine

## 2024-09-17 ENCOUNTER — Ambulatory Visit: Payer: Self-pay | Admitting: Physician Assistant

## 2024-09-20 ENCOUNTER — Telehealth (HOSPITAL_BASED_OUTPATIENT_CLINIC_OR_DEPARTMENT_OTHER): Payer: Self-pay

## 2024-09-20 ENCOUNTER — Other Ambulatory Visit: Payer: Self-pay | Admitting: Internal Medicine

## 2024-09-20 ENCOUNTER — Telehealth: Payer: Self-pay | Admitting: Internal Medicine

## 2024-09-20 DIAGNOSIS — L97912 Non-pressure chronic ulcer of unspecified part of right lower leg with fat layer exposed: Secondary | ICD-10-CM

## 2024-09-20 NOTE — Telephone Encounter (Signed)
 FYI;  Contacted patient to offer an appointment. Call was answered but disconnected. Attempted to call back, no answer.

## 2024-09-20 NOTE — Telephone Encounter (Signed)
 Patient returned call,Appointment scheduled

## 2024-09-20 NOTE — Telephone Encounter (Signed)
 Copied from CRM #[id number]. Topic: Clinical - Medical Advice >> Sep 20, 2024 12:35 PM Ashley R wrote:  Reason for CRM: Wound on left ankle, went to wound center, no openings until Feb, on waitlist, but to inform PCP. Requesting nurse callback [phone]

## 2024-09-20 NOTE — Telephone Encounter (Unsigned)
 Copied from CRM #[id number]. Topic: Clinical - Medication Refill >> Sep 20, 2024 12:50 PM Kendralyn S wrote: Medication: mupirocin  ointment (BACTROBAN ) 2 %  Has the patient contacted their pharmacy? Yes (Agent: If no, request that the patient contact the pharmacy for the refill. If patient does not wish to contact the pharmacy document the reason why and proceed with request.) (Agent: If yes, when and what did the pharmacy advise?)  This is the patient's preferred pharmacy:   [facility] [address], [address] [place] [address] Phone: [phone] Fax: [phone]  Is this the correct pharmacy for this prescription? Yes If no, delete pharmacy and type the correct one.   Has the prescription been filled recently? No  Is the patient out of the medication? No  Has the patient been seen for an appointment in the last year OR does the patient have an upcoming appointment? Yes  Can we respond through MyChart? No  Agent: Please be advised that Rx refills may take up to 3 business days. We ask that you follow-up with your pharmacy.

## 2024-09-21 ENCOUNTER — Other Ambulatory Visit: Payer: Self-pay

## 2024-09-21 ENCOUNTER — Ambulatory Visit: Payer: Self-pay | Admitting: Physician Assistant

## 2024-09-21 DIAGNOSIS — M25511 Pain in right shoulder: Secondary | ICD-10-CM

## 2024-09-21 MED ORDER — ACETAMINOPHEN-CODEINE 300-30 MG PO TABS
1.0000 | ORAL_TABLET | Freq: Two times a day (BID) | ORAL | 0 refills | Status: AC | PRN
  Filled 2024-09-21: qty 30, 15d supply, fill #0

## 2024-09-21 NOTE — Progress Notes (Signed)
 Office Visit Note   Patient: [name]           Date of Birth: 1956-08-08           MRN: [id number] Visit Date: 09/21/2024              Requested by: Vicci Barnie NOVAK, MD [address] [place],  [place] [address] PCP: Vicci Barnie NOVAK, MD   Assessment & Plan: Visit Diagnoses:  1. Acute pain of right shoulder     Plan: Impression is right shoulder pain following mechanical fall 2-1/2 months ago.  Patient's symptoms have not improved and she is having now only pain but weakness.  I am concerned based on her age and mechanism of injury she could have torn her rotator cuff.  Will go ahead and order an MRI to assess for this.  She will follow-up with us  once this has been completed.  I sent in Tylenol  3 to take in the meantime for pain.  Call with any concerns or questions.  Follow-Up Instructions: No follow-ups on file.   Orders:  Orders Placed This Encounter  Procedures   XR Shoulder Right   Meds ordered this encounter  Medications   acetaminophen -codeine  (TYLENOL  #3) 300-30 MG tablet    Sig: Take 1 tablet by mouth 2 (two) times daily as needed.    Dispense:  30 tablet    Refill:  0      Procedures: No procedures performed   Clinical Data: No additional findings.   Subjective: Chief Complaint  Patient presents with   Right Shoulder - Pain    HPI patient is a pleasant 68 year old female who comes in today with right shoulder pain.  She notes approximately 2-1/2 months ago she slipped and fell in the rain landing on her right side.  She has pad pain to the right shoulder ever since which has not worsened nor has improved.  The pain is throughout the entire shoulder and is worse with any lifting.  She was taking Tylenol  3 which did provide some relief until recently when she ran out.  She denies any paresthesias to the right upper extremity.  She does feel weak to the right upper extremity.  Review of Systems as detailed in HPI.  All others reviewed  and are negative.   Objective: Vital Signs: There were no vitals taken for this visit.  Physical Exam well-developed well-nourished female no acute distress.  Alert and oriented x 3.  Ortho Exam right shoulder exam: Forward flexion abduction to approximately 75 degrees.  I am actively unable to range her secondary to pain.  She does have some weakness to the right upper extremity compared to the left with resisted internal/external rotation.  She is neurovascularly intact distally.  Specialty Comments:  No specialty comments available.  Imaging: XR Shoulder Right Result Date: 09/21/2024 No acute fracture noted.  She does have pseudosubluxation of the humeral head.  Moderate degenerative changes the glenohumeral joint.  She does have spurring to the [facility] joint.    PMFS History: Patient Active Problem List   Diagnosis Date Noted   Type 2 diabetes mellitus with morbid obesity (HCC) 06/19/2021   Postmenopausal bleeding 12/12/2020   History of abnormal cervical Pap smear 12/12/2020   CKD (chronic kidney disease) stage 4, GFR 15-29 ml/min (HCC) 03/23/2020   LGSIL on Pap smear of cervix 12/29/2019   CKD (chronic kidney disease) stage 3, GFR 30-59 ml/min (HCC) 08/14/2018   Essential hypertension 05/15/2018  Reactive depression 05/15/2018   History of CVA (cerebrovascular accident) without residual deficits 05/15/2018   Past Medical History:  Diagnosis Date   Hypertension     Family History  Problem Relation Age of Onset   Stroke Mother     Past Surgical History:  Procedure Laterality Date   CESAREAN SECTION     Social History   Occupational History   Occupation: retired [employer]  Tobacco Use   Smoking status: Never   Smokeless tobacco: Never  Vaping Use   Vaping status: Never Used  Substance and Sexual Activity   Alcohol use: No   Drug use: Never   Sexual activity: Not Currently

## 2024-09-21 NOTE — Telephone Encounter (Signed)
 Patient will be seen on 09/23/24. All questions / concerns will be addressed at that time.   FYI - Dr.Johnson.

## 2024-09-21 NOTE — Addendum Note (Signed)
 Addended by: Sheriden Archibeque on: 09/21/2024 03:07 PM   Modules accepted: Orders

## 2024-09-22 ENCOUNTER — Telehealth: Payer: Self-pay | Admitting: Internal Medicine

## 2024-09-22 NOTE — Telephone Encounter (Signed)
 Phone call was placed to the wound center/clinic today.  I spoke with Dr. Marolyn to see if she was able to get this patient in much sooner.  She stated that they will put patient on the cancellation list but they do not have anything much sooner than what was already offered. I had her look at the patient's ulcer from pictures obtained in my notes.  She recommended that in the meantime have pt continue daily dressing changes. She recommends topical gentamicin  cream/ointment along with Santyl cream and have pt apply a nickle thick layer of the Santyl with Gentamicin  daily and cover. I checked with our clinical pharmacist, the Santyl cream  cost $300 which would be prohibitive for this patient. I tried calling [facility] to see if they have something similar that is less expensive but lines were busy. Will pass this info on to NP who will see her tomorrow.

## 2024-09-22 NOTE — Telephone Encounter (Signed)
 Pt unconfirmed appt no vm set-up

## 2024-09-23 ENCOUNTER — Ambulatory Visit: Payer: Self-pay | Attending: *Deleted | Admitting: *Deleted

## 2024-09-23 ENCOUNTER — Other Ambulatory Visit: Payer: Self-pay

## 2024-09-23 ENCOUNTER — Other Ambulatory Visit (HOSPITAL_COMMUNITY): Payer: Self-pay

## 2024-09-23 ENCOUNTER — Telehealth: Payer: Self-pay

## 2024-09-23 VITALS — BP 153/84 | HR 77 | Temp 97.6°F | Ht 61.0 in | Wt 230.8 lb

## 2024-09-23 DIAGNOSIS — L97912 Non-pressure chronic ulcer of unspecified part of right lower leg with fat layer exposed: Secondary | ICD-10-CM

## 2024-09-23 MED ORDER — GENTAMICIN SULFATE 0.1 % EX CREA
1.0000 | TOPICAL_CREAM | Freq: Three times a day (TID) | CUTANEOUS | 0 refills | Status: DC
  Filled 2024-09-23: qty 15, 7d supply, fill #0

## 2024-09-23 MED ORDER — TRAMADOL HCL 50 MG PO TABS
50.0000 mg | ORAL_TABLET | Freq: Every evening | ORAL | 0 refills | Status: AC
  Filled 2024-09-23 (×2): qty 15, 15d supply, fill #0

## 2024-09-23 MED FILL — Gentamicin Sulfate Cream 0.1%: 1.0000 | CUTANEOUS | 8 days supply | Qty: 15 | Fill #0 | Status: CN

## 2024-09-23 MED FILL — Gentamicin Sulfate Cream 0.1%: 1.0000 | CUTANEOUS | 30 days supply | Qty: 15 | Fill #0 | Status: AC

## 2024-09-23 NOTE — Telephone Encounter (Signed)
 Call to patient to make her aware that traMADol  (ULTRAM ) 50 MG tablet  and gentamicin  cream (GARAMYCIN ) 0.1 %  has been sent to her pharmacy. Unable to reach or leave message.

## 2024-09-23 NOTE — Telephone Encounter (Signed)
 Requested medication (s) are due for refill today: yes  Requested medication (s) are on the active medication list: yes  Last refill:  09/10/24  Future visit scheduled: yes  Notes to clinic:  Medication not assigned to a protocol, review manually.      Requested Prescriptions  Pending Prescriptions Disp Refills   mupirocin  ointment (BACTROBAN ) 2 % 22 g 0    Sig: Apply small amount TOPICALLY to affected area once a day.     Off-Protocol Failed - 09/23/2024  8:39 AM      Failed - Medication not assigned to a protocol, review manually.      Passed - Valid encounter within last 12 months    Recent Outpatient Visits           1 week ago Acute pain of right shoulder   [facility] [place]. [facility] Vicci Barnie NOVAK, MD   1 month ago Leg ulcer, right, with fat layer exposed [facility])   [facility] [place]. [facility] Vicci Barnie NOVAK, MD   1 month ago Skin infection   [facility] [place]. [facility] [place], [place] W, NP   6 months ago Type 2 diabetes mellitus with morbid obesity [facility])   [facility] [place]. [facility] Vicci Barnie B, MD   1 year ago Type 2 diabetes mellitus with morbid obesity [facility])   [facility] Vicci Barnie NOVAK, MD

## 2024-09-23 NOTE — Patient Instructions (Signed)
 We discussed your nonhealing leg wound from a fall you took in September 2025   We have talked to Dr. Marolyn at the wound care center and they suggested gentamicin  dressings twice a day. They also suggested Santyl which is very expensive. I will call [facility] to see if there is a more  reasonably priced alternative. I will call you with that information and plan accordingly for me.  Her AVS ready whenever you are thank you.  Not yeah what that hold great yeah I will get it yeah Continue dressing changes twice a day. Notify us  if the symptoms worsen.

## 2024-09-23 NOTE — Progress Notes (Signed)
 Patient ID: [name], female    DOB: September 28, 1956  MRN: [id number]  CC: ankle wound   Subjective: [name] is a 68 y.o. female who presents for follow-up on nonhealing ankle wound following a fall in September 2025 She was evaluated by Dr. Vicci on August 13, 2024 and encouraged to try bacitracin. The wound has not grown in size but continues to drain clear fluid.  No purulent drainage. An order for wound care center was placed by Dr. Vicci August 13, 2024 ( transportation issues have complicated keeping an appointment) Dr. Marolyn (wound care center provider ) called and talked to Dr. Vicci about putting her on the cancellation list. She also reviewed the pictures of the wound and suggested trying gentamicin  ointment or cream as well as Santyl. Per clinic Pharm.D. Curlee) Santyl is prohibitively expensive. Dr. Vicci attempted a call to [facility] but the line was busy.  Her concerns today include:  Patient with history of DM type 2, HTN, HL, CVA 2009, CKD 3-4, ACD, obesity.     Patient Active Problem List   Diagnosis Date Noted   Type 2 diabetes mellitus with morbid obesity (HCC) 06/19/2021   Postmenopausal bleeding 12/12/2020   History of abnormal cervical Pap smear 12/12/2020   CKD (chronic kidney disease) stage 4, GFR 15-29 ml/min (HCC) 03/23/2020   LGSIL on Pap smear of cervix 12/29/2019   CKD (chronic kidney disease) stage 3, GFR 30-59 ml/min (HCC) 08/14/2018   Essential hypertension 05/15/2018   Reactive depression 05/15/2018   History of CVA (cerebrovascular accident) without residual deficits 05/15/2018     Medications Ordered Prior to Encounter[1]  Allergies[2]  Social History   Socioeconomic History   Marital status: Married    Spouse name: Not on file   Number of children: Not on file   Years of education: Not on file   Highest education level: Not on file  Occupational History   Occupation: retired [employer]  Tobacco Use   Smoking  status: Never   Smokeless tobacco: Never  Vaping Use   Vaping status: Never Used  Substance and Sexual Activity   Alcohol use: No   Drug use: Never   Sexual activity: Not Currently  Other Topics Concern   Not on file  Social History Narrative   Not on file   Social Drivers of Health   Tobacco Use: Low Risk (09/10/2024)   Patient History    Smoking Tobacco Use: Never    Smokeless Tobacco Use: Never    Passive Exposure: Not on file  Financial Resource Strain: Not on file  Food Insecurity: Low Risk (05/19/2023)   Received from [facility]   Epic    Within the past 12 months, you worried that your food would run out before you got money to buy more: Never true    Within the past 12 months, the food you bought just didn't last and you didn't have money to get more. : Never true  Transportation Needs: Not on file (05/19/2023)  Physical Activity: Not on file  Stress: Not on file  Social Connections: Not on file  Intimate Partner Violence: Not on file  Depression (PHQ2-9): Low Risk (08/13/2024)   Depression (PHQ2-9)    PHQ-2 Score: 0  Alcohol Screen: Not on file  Housing: Low Risk (05/19/2023)   Received from [facility]   Epic    What is your living situation today?: I have a steady place to live    Think about the place  you live. Do you have problems with any of the following? Choose all that apply:: None/None on this list  Utilities: Low Risk (05/19/2023)   Received from [facility]   Utilities    In the past 12 months has the electric, gas, oil, or water company threatened to shut off services in your home? : No  Health Literacy: Not on file    Family History  Problem Relation Age of Onset   Stroke Mother     Past Surgical History:  Procedure Laterality Date   CESAREAN SECTION      ROS: Review of Systems Negative except as stated above  PHYSICAL EXAM: BP (!) 153/84 (BP Location: Right Arm, Patient Position: Sitting, Cuff Size: Normal)   Pulse 77   Temp 97.6  F (36.4 C) (Oral)   Ht 5' 1 (1.549 m)   Wt 230 lb 12.8 oz (104.7 kg)   SpO2 98%   BMI 43.61 kg/m   Physical Exam Vitals and nursing note reviewed.  Constitutional:      Appearance: Normal appearance.  Cardiovascular:     Rate and Rhythm: Normal rate and regular rhythm.  Pulmonary:     Effort: Pulmonary effort is normal.     Breath sounds: Normal breath sounds.  Skin:    General: Skin is warm and dry.     Comments: An approximate 4 x 4 centimeter open wound on the lateral aspect of the left ankle. Clear drainage on dressing. Fat exposed with healthy tissue as well  Neurological:     Mental Status: She is alert and oriented to person, place, and time. Mental status is at baseline.          Latest Ref Rng & Units 02/27/2024   11:49 AM 02/25/2023   12:25 PM 10/25/2021    3:32 PM  CMP  Glucose 70 - 99 mg/dL 877  889  95   BUN 8 - 27 mg/dL 30  25  43   Creatinine 0.57 - 1.00 mg/dL 8.13  8.01  7.40   Sodium 134 - 144 mmol/L 142  142  142   Potassium 3.5 - 5.2 mmol/L 4.4  3.7  4.5   Chloride 96 - 106 mmol/L 108  104  105   CO2 20 - 29 mmol/L 17  22  22    Calcium  8.7 - 10.3 mg/dL 89.9  9.8  9.8   Total Protein 6.0 - 8.5 g/dL 7.1  7.2  7.4   Total Bilirubin 0.0 - 1.2 mg/dL 0.3  0.4  0.3   Alkaline Phos 44 - 121 IU/L 64  70  65   AST 0 - 40 IU/L 18  22  25    ALT 0 - 32 IU/L 12  17  18     Lipid Panel     Component Value Date/Time   CHOL 180 02/27/2024 1149   TRIG 59 02/27/2024 1149   HDL 89 02/27/2024 1149   CHOLHDL 2.0 02/27/2024 1149   CHOLHDL 2.2 Ratio 11/29/2010 2115   VLDL 13 11/29/2010 2115   LDLCALC 80 02/27/2024 1149   LDLDIRECT 108 (H) 05/07/2010 1841    CBC    Component Value Date/Time   WBC 6.2 08/03/2024 1536   WBC 5.9 04/19/2021 2214   RBC 3.60 (L) 08/03/2024 1536   RBC 4.00 04/19/2021 2214   HGB 10.5 (L) 08/03/2024 1536   HCT 33.1 (L) 08/03/2024 1536   PLT 224 08/03/2024 1536   MCV 92 08/03/2024 1536   MCH 29.2 08/03/2024  1536   MCH 29.8  04/19/2021 2214   MCHC 31.7 08/03/2024 1536   MCHC 32.9 04/19/2021 2214   RDW 12.7 08/03/2024 1536   LYMPHSABS 1.4 08/03/2024 1536   MONOABS 0.4 01/06/2014 1802   EOSABS 0.2 08/03/2024 1536   BASOSABS 0.0 08/03/2024 1536    Results for orders placed or performed in visit on 08/13/24  POCT glucose (manual entry)   Collection Time: 08/13/24  4:50 PM  Result Value Ref Range   POC Glucose 119 (A) 70 - 99 mg/dl  POCT glycosylated hemoglobin (Hb A1C)   Collection Time: 08/13/24  4:54 PM  Result Value Ref Range   Hemoglobin A1C     HbA1c POC (<> result, manual entry)     HbA1c, POC (prediabetic range)     HbA1c, POC (controlled diabetic range) 6.4 0.0 - 7.0 %     ASSESSMENT AND PLAN:  Assessment & Plan Leg ulcer, right, with fat layer exposed (HCC) This is a follow-up of a wound that is been present since September 2025 after a fall. Initially evaluated by Dr. Vicci on August 13, 2024 and encouraged to try bacitracin and referred for wound care The wound is  approximately 4 x 4 cm with fat exposed though there is some healthy wound bed. Dr. Marolyn (wound care center provider ) has been consulted and suggested gentamicin  and Santyl Due to the cost of Santyl will call [facility] for alternative Per clinic Pharm.D. Curlee) Santyl is prohibitively expensive. Nurse Mathilda) dressed wound with Xeroform gauze and Ace wrap. At the end of the visit she did ask for something for pain will order tramadol  50 mg daily with dressing change #15 no refill Orders:   gentamicin  cream (GARAMYCIN ) 0.1 %; Apply 1 Application topically in the morning and at bedtime. Okay for tramadol  50 mg 1 daily with dressing change  On cancellation list for wound care per [facility] no alternatives to Santyl. Will continue gentamicin  only If wound worsens call clinic or return to ER/urgent care.     I did note that her blood pressure was mildly elevated. She is encouraged to take her medications as  prescribed, adhere to a heart healthy diet and keep track of her readings to review with her PCP at her next visit   Patient was given the opportunity to ask questions.  Patient verbalized understanding of the plan and was able to repeat key elements of the plan.   This documentation was completed using Paediatric nurse.  Any transcriptional errors are unintentional.     Requested Prescriptions   Signed Prescriptions Disp Refills   gentamicin  cream (GARAMYCIN ) 0.1 % 15 g 0    Sig: Apply 1 Application topically in the morning and at bedtime.   traMADol  (ULTRAM ) 50 MG tablet 15 tablet 0    Sig: Take 1 tablet (50 mg total) by mouth at bedtime for 5 days.    No follow-ups on file.  Tammee Thielke H, NP      [1]  Current Outpatient Medications on File Prior to Visit  Medication Sig Dispense Refill   acetaminophen -codeine  (TYLENOL  #3) 300-30 MG tablet Take 1 tablet by mouth 2 (two) times daily as needed. 30 tablet 0   amLODipine  (NORVASC ) 5 MG tablet Take 1 tablet (5 mg total) by mouth 2 (two) times daily. 180 tablet 1   amoxicillin -clavulanate (AUGMENTIN ) 500-125 MG tablet Take 1 tablet by mouth in the morning and at bedtime. 14 tablet 0   atorvastatin  (LIPITOR) 40 MG  tablet Take 1 tablet (40 mg total) by mouth daily. 90 tablet 1   furosemide  (LASIX ) 20 MG tablet Take 1 tablet (20 mg total) by mouth daily. 90 tablet 1   hydrALAZINE  (APRESOLINE ) 100 MG tablet Take 1 tablet (100 mg total) by mouth 3 (three) times daily. 270 tablet 1   isosorbide  dinitrate (ISORDIL ) 20 MG tablet Take 1 tablet (20 mg total) by mouth 3 (three) times daily. 270 tablet 1   labetalol  (NORMODYNE ) 200 MG tablet Take 1 tablet (200 mg total) by mouth 3 (three) times daily. 270 tablet 1   lisinopril  (ZESTRIL ) 2.5 MG tablet Take 1 tablet (2.5 mg total) by mouth daily. Must keep appointment in March for additional refill 90 tablet 1   mupirocin  ointment (BACTROBAN ) 2 % Apply small amount TOPICALLY to  affected area once a day. 22 g 0   No current facility-administered medications on file prior to visit.  [2] No Known Allergies

## 2024-10-12 ENCOUNTER — Other Ambulatory Visit: Payer: Self-pay

## 2024-10-13 ENCOUNTER — Other Ambulatory Visit: Payer: Self-pay

## 2024-10-15 ENCOUNTER — Other Ambulatory Visit: Payer: Self-pay

## 2024-11-01 ENCOUNTER — Telehealth: Payer: Self-pay | Admitting: Internal Medicine

## 2024-11-01 ENCOUNTER — Encounter (HOSPITAL_BASED_OUTPATIENT_CLINIC_OR_DEPARTMENT_OTHER): Payer: Self-pay | Admitting: General Surgery

## 2024-11-01 NOTE — Telephone Encounter (Signed)
 contacted pt left text and called 4x to resch appt  phone not working (cancel appt )

## 2024-11-01 NOTE — Telephone Encounter (Signed)
 Due to inclement weather, our office will be closed tomorrow 11/02/2024 . Well reach out to reschedule your appointment. Thank you.Please call [phone] ( if the pt calls back please resch appt )

## 2024-11-01 NOTE — Telephone Encounter (Signed)
 Contacted pt no dial tone to call the pt to resch appt

## 2024-11-02 ENCOUNTER — Ambulatory Visit: Payer: Self-pay | Admitting: Internal Medicine

## 2024-11-11 ENCOUNTER — Encounter: Payer: Self-pay | Admitting: Physician Assistant

## 2024-11-11 ENCOUNTER — Emergency Department
Admission: EM | Admit: 2024-11-11 | Discharge: 2024-11-11 | Discharge status: Against Medical Advice | Payer: Self-pay | Source: Home / Self Care

## 2024-11-16 ENCOUNTER — Encounter: Payer: Self-pay | Admitting: Physician Assistant

## 2024-12-10 ENCOUNTER — Ambulatory Visit: Payer: Self-pay | Admitting: Internal Medicine
